# Patient Record
Sex: Female | Born: 1975 | Race: White | Hispanic: No | Smoking: Current every day smoker
Health system: Southern US, Community
[De-identification: ages and names within clinical notes are randomized; demographics above are authoritative.]

## PROBLEM LIST (undated history)

## (undated) DIAGNOSIS — F329 Major depressive disorder, single episode, unspecified: Secondary | ICD-10-CM

## (undated) DIAGNOSIS — J45909 Unspecified asthma, uncomplicated: Secondary | ICD-10-CM

## (undated) DIAGNOSIS — F32A Depression, unspecified: Secondary | ICD-10-CM

## (undated) DIAGNOSIS — F419 Anxiety disorder, unspecified: Secondary | ICD-10-CM

## (undated) DIAGNOSIS — F99 Mental disorder, not otherwise specified: Secondary | ICD-10-CM

## (undated) DIAGNOSIS — K219 Gastro-esophageal reflux disease without esophagitis: Secondary | ICD-10-CM

## (undated) DIAGNOSIS — G4730 Sleep apnea, unspecified: Secondary | ICD-10-CM

## (undated) HISTORY — DX: Unspecified asthma, uncomplicated: J45.909

---

## 1998-01-01 ENCOUNTER — Other Ambulatory Visit: Admission: RE | Admit: 1998-01-01 | Discharge: 1998-01-01 | Payer: Self-pay | Admitting: Obstetrics and Gynecology

## 1998-07-16 ENCOUNTER — Inpatient Hospital Stay (HOSPITAL_COMMUNITY): Admission: AD | Admit: 1998-07-16 | Discharge: 1998-07-20 | Payer: Self-pay | Admitting: Obstetrics and Gynecology

## 1998-09-05 ENCOUNTER — Other Ambulatory Visit: Admission: RE | Admit: 1998-09-05 | Discharge: 1998-09-05 | Payer: Self-pay | Admitting: Obstetrics and Gynecology

## 2000-11-23 ENCOUNTER — Encounter: Payer: Self-pay | Admitting: Internal Medicine

## 2000-11-23 ENCOUNTER — Ambulatory Visit (HOSPITAL_COMMUNITY): Admission: AD | Admit: 2000-11-23 | Discharge: 2000-11-23 | Payer: Self-pay | Admitting: Obstetrics and Gynecology

## 2001-04-14 ENCOUNTER — Emergency Department (HOSPITAL_COMMUNITY): Admission: EM | Admit: 2001-04-14 | Discharge: 2001-04-14 | Payer: Self-pay | Admitting: Emergency Medicine

## 2001-04-14 ENCOUNTER — Encounter: Payer: Self-pay | Admitting: Emergency Medicine

## 2001-05-24 ENCOUNTER — Encounter: Payer: Self-pay | Admitting: Family Medicine

## 2001-05-24 ENCOUNTER — Ambulatory Visit (HOSPITAL_COMMUNITY): Admission: RE | Admit: 2001-05-24 | Discharge: 2001-05-24 | Payer: Self-pay | Admitting: Family Medicine

## 2001-11-21 ENCOUNTER — Emergency Department (HOSPITAL_COMMUNITY): Admission: EM | Admit: 2001-11-21 | Discharge: 2001-11-21 | Payer: Self-pay | Admitting: Emergency Medicine

## 2001-11-23 ENCOUNTER — Other Ambulatory Visit: Admission: RE | Admit: 2001-11-23 | Discharge: 2001-11-23 | Payer: Self-pay | Admitting: *Deleted

## 2001-11-28 ENCOUNTER — Ambulatory Visit (HOSPITAL_COMMUNITY): Admission: RE | Admit: 2001-11-28 | Discharge: 2001-11-28 | Payer: Self-pay | Admitting: *Deleted

## 2001-12-02 ENCOUNTER — Inpatient Hospital Stay (HOSPITAL_COMMUNITY): Admission: AD | Admit: 2001-12-02 | Discharge: 2001-12-02 | Payer: Self-pay | Admitting: Obstetrics and Gynecology

## 2001-12-02 ENCOUNTER — Encounter: Payer: Self-pay | Admitting: Obstetrics and Gynecology

## 2002-03-24 ENCOUNTER — Emergency Department (HOSPITAL_COMMUNITY): Admission: EM | Admit: 2002-03-24 | Discharge: 2002-03-24 | Payer: Self-pay | Admitting: Emergency Medicine

## 2002-07-15 ENCOUNTER — Inpatient Hospital Stay (HOSPITAL_COMMUNITY): Admission: AD | Admit: 2002-07-15 | Discharge: 2002-07-15 | Payer: Self-pay | Admitting: Obstetrics & Gynecology

## 2003-10-17 ENCOUNTER — Emergency Department (HOSPITAL_COMMUNITY): Admission: EM | Admit: 2003-10-17 | Discharge: 2003-10-17 | Payer: Self-pay

## 2004-05-17 ENCOUNTER — Emergency Department: Payer: Self-pay | Admitting: Internal Medicine

## 2004-09-29 ENCOUNTER — Emergency Department (HOSPITAL_COMMUNITY): Admission: EM | Admit: 2004-09-29 | Discharge: 2004-09-29 | Payer: Self-pay | Admitting: Emergency Medicine

## 2005-01-19 ENCOUNTER — Emergency Department: Payer: Self-pay | Admitting: Emergency Medicine

## 2005-07-22 ENCOUNTER — Emergency Department: Payer: Self-pay | Admitting: Emergency Medicine

## 2005-11-07 ENCOUNTER — Emergency Department: Payer: Self-pay | Admitting: Emergency Medicine

## 2005-12-21 ENCOUNTER — Emergency Department: Payer: Self-pay | Admitting: Emergency Medicine

## 2005-12-26 ENCOUNTER — Other Ambulatory Visit: Payer: Self-pay

## 2005-12-26 ENCOUNTER — Emergency Department: Payer: Self-pay | Admitting: Emergency Medicine

## 2006-01-28 ENCOUNTER — Emergency Department: Payer: Self-pay | Admitting: Emergency Medicine

## 2006-02-01 ENCOUNTER — Emergency Department: Payer: Self-pay | Admitting: Emergency Medicine

## 2006-07-31 ENCOUNTER — Emergency Department: Payer: Self-pay | Admitting: Internal Medicine

## 2006-08-24 ENCOUNTER — Emergency Department: Payer: Self-pay | Admitting: Unknown Physician Specialty

## 2006-08-31 ENCOUNTER — Emergency Department: Payer: Self-pay | Admitting: Emergency Medicine

## 2006-11-02 ENCOUNTER — Emergency Department: Payer: Self-pay | Admitting: Emergency Medicine

## 2006-12-17 ENCOUNTER — Emergency Department: Payer: Self-pay | Admitting: Emergency Medicine

## 2007-01-10 ENCOUNTER — Emergency Department: Payer: Self-pay | Admitting: Emergency Medicine

## 2007-02-10 ENCOUNTER — Emergency Department: Payer: Self-pay | Admitting: Emergency Medicine

## 2007-02-11 ENCOUNTER — Emergency Department: Payer: Self-pay | Admitting: Emergency Medicine

## 2007-02-17 ENCOUNTER — Emergency Department: Payer: Self-pay | Admitting: Emergency Medicine

## 2007-02-23 ENCOUNTER — Other Ambulatory Visit: Payer: Self-pay

## 2007-02-23 ENCOUNTER — Emergency Department: Payer: Self-pay | Admitting: Emergency Medicine

## 2007-02-27 ENCOUNTER — Emergency Department: Payer: Self-pay | Admitting: Emergency Medicine

## 2007-02-27 ENCOUNTER — Other Ambulatory Visit: Payer: Self-pay

## 2007-03-10 ENCOUNTER — Emergency Department: Payer: Self-pay | Admitting: Emergency Medicine

## 2007-07-27 ENCOUNTER — Emergency Department: Payer: Self-pay | Admitting: Emergency Medicine

## 2008-05-03 HISTORY — PX: ABDOMINAL HYSTERECTOMY: SHX81

## 2009-06-29 ENCOUNTER — Emergency Department: Payer: Self-pay | Admitting: Emergency Medicine

## 2009-07-03 ENCOUNTER — Emergency Department: Payer: Self-pay | Admitting: Emergency Medicine

## 2009-08-11 ENCOUNTER — Emergency Department: Payer: Self-pay | Admitting: Emergency Medicine

## 2009-08-12 ENCOUNTER — Emergency Department: Payer: Self-pay

## 2009-10-12 ENCOUNTER — Emergency Department: Payer: Self-pay | Admitting: Emergency Medicine

## 2010-03-24 ENCOUNTER — Emergency Department: Payer: Self-pay

## 2010-03-27 ENCOUNTER — Emergency Department: Payer: Self-pay | Admitting: Emergency Medicine

## 2010-07-07 ENCOUNTER — Emergency Department: Payer: Self-pay | Admitting: Emergency Medicine

## 2010-09-09 ENCOUNTER — Ambulatory Visit: Payer: Self-pay | Admitting: Family Medicine

## 2010-12-14 ENCOUNTER — Emergency Department: Payer: Self-pay | Admitting: *Deleted

## 2011-01-26 ENCOUNTER — Emergency Department: Payer: Self-pay | Admitting: Emergency Medicine

## 2011-02-02 ENCOUNTER — Emergency Department: Payer: Self-pay | Admitting: *Deleted

## 2011-04-24 ENCOUNTER — Emergency Department: Payer: Self-pay | Admitting: *Deleted

## 2012-02-16 ENCOUNTER — Inpatient Hospital Stay: Payer: Self-pay | Admitting: Psychiatry

## 2012-02-16 LAB — URINALYSIS, COMPLETE
Bilirubin,UR: NEGATIVE
Nitrite: NEGATIVE
Protein: 30
RBC,UR: 1 /HPF (ref 0–5)
Specific Gravity: 1.026 (ref 1.003–1.030)
WBC UR: 4 /HPF (ref 0–5)

## 2012-02-16 LAB — CBC
HCT: 44.1 % (ref 35.0–47.0)
MCV: 90 fL (ref 80–100)
Platelet: 298 10*3/uL (ref 150–440)
RBC: 4.92 10*6/uL (ref 3.80–5.20)
RDW: 13.2 % (ref 11.5–14.5)
WBC: 13.5 10*3/uL — ABNORMAL HIGH (ref 3.6–11.0)

## 2012-02-16 LAB — COMPREHENSIVE METABOLIC PANEL
Albumin: 4.2 g/dL (ref 3.4–5.0)
Alkaline Phosphatase: 134 U/L (ref 50–136)
Anion Gap: 9 (ref 7–16)
BUN: 10 mg/dL (ref 7–18)
Bilirubin,Total: 0.2 mg/dL (ref 0.2–1.0)
Calcium, Total: 9.3 mg/dL (ref 8.5–10.1)
Co2: 30 mmol/L (ref 21–32)
Creatinine: 0.74 mg/dL (ref 0.60–1.30)
Glucose: 133 mg/dL — ABNORMAL HIGH (ref 65–99)
Osmolality: 282 (ref 275–301)
Sodium: 141 mmol/L (ref 136–145)
Total Protein: 8.2 g/dL (ref 6.4–8.2)

## 2012-02-16 LAB — ETHANOL
Ethanol %: <0.003 % (ref 0.000–0.080)
Ethanol: <3 mg/dL

## 2012-02-16 LAB — DRUG SCREEN, URINE
Amphetamines, Ur Screen: NEGATIVE (ref ?–1000)
Barbiturates, Ur Screen: NEGATIVE (ref ?–200)
Benzodiazepine, Ur Scrn: NEGATIVE (ref ?–200)
Cannabinoid 50 Ng, Ur ~~LOC~~: NEGATIVE (ref ?–50)
Cocaine Metabolite,Ur ~~LOC~~: NEGATIVE (ref ?–300)
MDMA (Ecstasy)Ur Screen: POSITIVE (ref ?–500)
Methadone, Ur Screen: NEGATIVE (ref ?–300)
Phencyclidine (PCP) Ur S: NEGATIVE (ref ?–25)

## 2012-02-16 LAB — TSH: Thyroid Stimulating Horm: 1.15 u[IU]/mL

## 2012-02-17 LAB — LIPID PANEL
HDL Cholesterol: 31 mg/dL — ABNORMAL LOW (ref 40–60)
Ldl Cholesterol, Calc: 98 mg/dL (ref 0–100)
VLDL Cholesterol, Calc: 42 mg/dL — ABNORMAL HIGH (ref 5–40)

## 2013-01-28 ENCOUNTER — Emergency Department: Payer: Self-pay | Admitting: Emergency Medicine

## 2013-01-28 LAB — COMPREHENSIVE METABOLIC PANEL
Albumin: 3.6 g/dL (ref 3.4–5.0)
Alkaline Phosphatase: 129 U/L (ref 50–136)
BUN: 7 mg/dL (ref 7–18)
Bilirubin,Total: 0.3 mg/dL (ref 0.2–1.0)
Co2: 33 mmol/L — ABNORMAL HIGH (ref 21–32)
Creatinine: 0.74 mg/dL (ref 0.60–1.30)
EGFR (Non-African Amer.): >60
Osmolality: 278 (ref 275–301)
Potassium: 3.5 mmol/L (ref 3.5–5.1)
SGOT(AST): 20 U/L (ref 15–37)
SGPT (ALT): 20 U/L (ref 12–78)
Total Protein: 7.3 g/dL (ref 6.4–8.2)

## 2013-01-28 LAB — CBC
HCT: 38.2 % (ref 35.0–47.0)
MCH: 31.1 pg (ref 26.0–34.0)
MCV: 90 fL (ref 80–100)
Platelet: 261 10*3/uL (ref 150–440)
RBC: 4.25 10*6/uL (ref 3.80–5.20)
WBC: 12.4 10*3/uL — ABNORMAL HIGH (ref 3.6–11.0)

## 2013-01-28 LAB — URINALYSIS, COMPLETE
Bacteria: NONE SEEN
Bilirubin,UR: NEGATIVE
Blood: NEGATIVE
Ph: 6 (ref 4.5–8.0)
Protein: NEGATIVE
RBC,UR: 1 /HPF (ref 0–5)
Specific Gravity: 1.019 (ref 1.003–1.030)
Squamous Epithelial: 5
WBC UR: <1 /HPF (ref 0–5)

## 2013-01-28 LAB — LIPASE, BLOOD: Lipase: 107 U/L (ref 73–393)

## 2013-01-29 ENCOUNTER — Emergency Department (HOSPITAL_COMMUNITY): Payer: Medicaid Other

## 2013-01-29 ENCOUNTER — Emergency Department (HOSPITAL_COMMUNITY)
Admission: EM | Admit: 2013-01-29 | Discharge: 2013-01-29 | Disposition: A | Discharge status: Home / Self Care | Payer: Medicaid Other | Attending: Emergency Medicine | Admitting: Emergency Medicine

## 2013-01-29 ENCOUNTER — Encounter (HOSPITAL_COMMUNITY): Payer: Self-pay | Admitting: *Deleted

## 2013-01-29 DIAGNOSIS — Z3202 Encounter for pregnancy test, result negative: Secondary | ICD-10-CM | POA: Insufficient documentation

## 2013-01-29 DIAGNOSIS — F17200 Nicotine dependence, unspecified, uncomplicated: Secondary | ICD-10-CM | POA: Insufficient documentation

## 2013-01-29 DIAGNOSIS — Z79899 Other long term (current) drug therapy: Secondary | ICD-10-CM | POA: Insufficient documentation

## 2013-01-29 DIAGNOSIS — J45909 Unspecified asthma, uncomplicated: Secondary | ICD-10-CM | POA: Insufficient documentation

## 2013-01-29 DIAGNOSIS — K8020 Calculus of gallbladder without cholecystitis without obstruction: Secondary | ICD-10-CM

## 2013-01-29 DIAGNOSIS — R112 Nausea with vomiting, unspecified: Secondary | ICD-10-CM | POA: Insufficient documentation

## 2013-01-29 DIAGNOSIS — R197 Diarrhea, unspecified: Secondary | ICD-10-CM | POA: Insufficient documentation

## 2013-01-29 LAB — CBC WITH DIFFERENTIAL/PLATELET
Eosinophils Relative: 3 % (ref 0–5)
HCT: 39.9 % (ref 36.0–46.0)
Hemoglobin: 13.4 g/dL (ref 12.0–15.0)
Lymphocytes Relative: 29 % (ref 12–46)
MCH: 30.5 pg (ref 26.0–34.0)
Monocytes Absolute: 0.6 10*3/uL (ref 0.1–1.0)
Neutrophils Relative %: 62 % (ref 43–77)
Platelets: 264 10*3/uL (ref 150–400)
RDW: 12.4 % (ref 11.5–15.5)
WBC: 10.6 10*3/uL — ABNORMAL HIGH (ref 4.0–10.5)

## 2013-01-29 LAB — COMPREHENSIVE METABOLIC PANEL
Alkaline Phosphatase: 114 U/L (ref 39–117)
BUN: 6 mg/dL (ref 6–23)
CO2: 30 mEq/L (ref 19–32)
Calcium: 8.9 mg/dL (ref 8.4–10.5)
Chloride: 101 mEq/L (ref 96–112)
Potassium: 3.4 mEq/L — ABNORMAL LOW (ref 3.5–5.1)
Total Protein: 7.6 g/dL (ref 6.0–8.3)

## 2013-01-29 LAB — URINALYSIS, ROUTINE W REFLEX MICROSCOPIC
Bilirubin Urine: NEGATIVE
Glucose, UA: NEGATIVE mg/dL
Hgb urine dipstick: NEGATIVE
Ketones, ur: NEGATIVE mg/dL
pH: 6 (ref 5.0–8.0)

## 2013-01-29 LAB — LIPASE, BLOOD: Lipase: 27 U/L (ref 11–59)

## 2013-01-29 LAB — POCT PREGNANCY, URINE: Preg Test, Ur: NEGATIVE

## 2013-01-29 MED ORDER — SODIUM CHLORIDE 0.9 % IV SOLN
Freq: Once | INTRAVENOUS | Status: AC
  Administered 2013-01-29: 18:00:00 via INTRAVENOUS

## 2013-01-29 MED ORDER — HYDROMORPHONE HCL PF 1 MG/ML IJ SOLN
1.0000 mg | Freq: Once | INTRAMUSCULAR | Status: AC
  Administered 2013-01-29: 1 mg via INTRAVENOUS
  Filled 2013-01-29: qty 1

## 2013-01-29 MED ORDER — ONDANSETRON HCL 4 MG/2ML IJ SOLN
4.0000 mg | Freq: Once | INTRAMUSCULAR | Status: AC
  Administered 2013-01-29: 4 mg via INTRAVENOUS
  Filled 2013-01-29: qty 2

## 2013-01-29 MED ORDER — ONDANSETRON HCL 8 MG PO TABS: 8.0000 mg | ORAL_TABLET | Freq: Three times a day (TID) | ORAL | Status: DC | PRN

## 2013-01-29 MED ORDER — HYDROMORPHONE HCL PF 1 MG/ML IJ SOLN
0.5000 mg | Freq: Once | INTRAMUSCULAR | Status: AC
  Administered 2013-01-29: 0.5 mg via INTRAVENOUS
  Filled 2013-01-29: qty 1

## 2013-01-29 MED ORDER — OXYCODONE-ACETAMINOPHEN 5-325 MG PO TABS: 1.0000 | ORAL_TABLET | ORAL | Status: DC | PRN

## 2013-01-29 NOTE — ED Provider Notes (Signed)
Pt received from [place], [place].  Pt presented to ED w/ 2 weeks RUQ pain and N/V, Abd [place] today showed cholelithiasis and labs unremarkable.  Patient aware of all test results.  Her pain is improved currently and she is tolerating pos.  D/c'd home w/ percocet, zofran and referral to [facility].  Return precautions discussed. 9:52 PM   Otilio Miu, PA-C 01/29/13 2152

## 2013-01-29 NOTE — ED Provider Notes (Signed)
CSN: [id number]     Arrival date & time 01/29/13  1231 History   First MD Initiated Contact with Patient 01/29/13 1727     No chief complaint on file.  (Consider location/radiation/quality/duration/timing/severity/associated sxs/prior Treatment) HPI Tracie Hodge is a 37 y.o. female presents to emergency department complaining of abdominal pain nausea, vomiting, diarrhea. States symptoms began 2 weeks ago. States symptoms don't gradually worsening. Patient states she has tried taking ibuprofen and Zantac for the pain with no improvement. Patient states pain and vomiting is worse after each pain. States no loss 2 days the pain has been constant. Pain is mainly in the right upper quadrant radiating to the right flank. Patient states she went to [facility] emergency department yesterday where the blood work and an x-ray and discharged her home. Patient states today she tried to follow up with her primary care doctor and call them but they did not have an appointment for her. Patient states that in the last 2 days she hasn't been able to keep anything down. Denies blood in her emesis or stool. No history of the same. History reviewed. No pertinent past medical history. Past Surgical History  Procedure Laterality Date  . Abdominal hysterectomy    . Cesarean section     No family history on file. History  Substance Use Topics  . Smoking status: Current Every Day Smoker  . Smokeless tobacco: Not on file  . Alcohol Use: No   OB History   Grav Para Term Preterm Abortions TAB SAB Ect Mult Living                 Review of Systems  Constitutional: Negative for fever and chills.  HENT: Negative for neck pain and neck stiffness.   Respiratory: Negative for cough, chest tightness and shortness of breath.   Cardiovascular: Negative for chest pain, palpitations and leg swelling.  Gastrointestinal: Positive for nausea, vomiting, abdominal pain and diarrhea.  Genitourinary: Negative for dysuria, flank  pain, vaginal bleeding, vaginal discharge, vaginal pain and pelvic pain.  Musculoskeletal: Negative for myalgias and arthralgias.  Skin: Negative for rash.  Neurological: Negative for dizziness, weakness and headaches.  All other systems reviewed and are negative.    Allergies  Review of patient's allergies indicates no known allergies.  Home Medications   Current Outpatient Rx  Name  Route  Sig  Dispense  Refill  . buPROPion (WELLBUTRIN XL) 300 MG 24 hr tablet   Oral   Take 450 mg by mouth every morning.         Marland Kitchen ibuprofen (ADVIL,MOTRIN) 200 MG tablet   Oral   Take 800 mg by mouth every 6 (six) hours as needed for pain.         . ranitidine (ZANTAC) 150 MG tablet   Oral   Take 150 mg by mouth at bedtime.         . trazodone (DESYREL) 300 MG tablet   Oral   Take 300 mg by mouth at bedtime.          BP 122/76  Pulse 61  Temp(Src) 98 F (36.7 C) (Oral)  Resp 17  SpO2 96% Physical Exam  Nursing note and vitals reviewed. Constitutional: She is oriented to person, place, and time. She appears well-developed and well-nourished. No distress.  HENT:  Head: Normocephalic.  Eyes: Conjunctivae are normal.  Neck: Neck supple.  Cardiovascular: Normal rate, regular rhythm and normal heart sounds.   Pulmonary/Chest: Effort normal and breath sounds normal. No respiratory  distress. She has no wheezes. She has no rales.  Abdominal: Soft. Bowel sounds are normal. She exhibits no distension. There is tenderness. There is no rebound and no guarding.  Right upper quadrant tenderness  Musculoskeletal: She exhibits no edema.  Neurological: She is alert and oriented to person, place, and time.  Skin: Skin is warm and dry.  Psychiatric: She has a normal mood and affect. Her behavior is normal.    ED Course  Procedures (including critical care time) Labs Review Labs Reviewed  COMPREHENSIVE METABOLIC PANEL - Abnormal; Notable for the following:    Potassium 3.4 (*)     Glucose, Bld 114 (*)    All other components within normal limits  CBC WITH DIFFERENTIAL - Abnormal; Notable for the following:    WBC 10.6 (*)    All other components within normal limits  URINALYSIS, ROUTINE W REFLEX MICROSCOPIC  LIPASE, BLOOD  POCT I-STAT TROPONIN I  POCT PREGNANCY, URINE   Imaging Review US Abdomen Complete  01/29/2013   CLINICAL DATA:  Right upper quadrant abdominal pain.  EXAM: ULTRASOUND ABDOMEN COMPLETE  COMPARISON:  None.  FINDINGS: Gallbladder  1.8 cm stone of the gallbladder. Wall thickness is within normal limits. Negative sonographic Murphy sign.  Common bile duct  Diameter: Normal. 6 mm in diameter.  Liver  No focal lesion identified. Within normal limits in parenchymal echogenicity.  IVC  No abnormality visualized.  Pancreas  Visualized portion unremarkable.  Spleen  Size and appearance within normal limits.  Right Kidney  Length: 11.8 cm Echogenicity within normal limits. No mass or hydronephrosis visualized.  Left Kidney  Length: 12.4 cm Echogenicity within normal limits. No mass or hydronephrosis visualized.  Abdominal aorta  Maximum diameter 2.5 cm.  IMPRESSION: 1. Large stone of the gallbladder. 2. Otherwise normal exam.   Electronically Signed   By: Geanie Cooley   On: 01/29/2013 19:31    MDM  No diagnosis found.  Patient here with right upper quadrant abdominal pain for several weeks, pain has been intermittent until last few days there has been constant. Also having nausea, vomiting, loose stools. On exam patient did have right upper quadrant tenderness, no guarding, no rebound tenderness. Ultrasound obtained after all labs came back normal. Ultrasound showed a large gallstone in the gallbladder. There is no signs of cholecystitis. I discussed results with patient and with Dr. Fonnie Jarvis. Will try that her pain management emergency department. If unable to get pain under control, will call surgery.  8:35 PM Pt pending 3rd dose of pain medications. Pain slightly  improved. Pt signed out to Dr. Fonnie Jarvis and PA schinlever at shift change. Plan pain reassessment, disposition based on improvement.   Filed Vitals:   01/29/13 1242 01/29/13 1718 01/29/13 1914  BP: 133/85 122/76 104/60  Pulse: 87 61 63  Temp: 98 F (36.7 C)    TempSrc: Oral    Resp: 18 17 17   SpO2: 97% 96% 93%       Lottie Mussel, PA-C 01/29/13 2036

## 2013-01-29 NOTE — ED Notes (Signed)
Tracie Hodge - RTSA [phone]

## 2013-01-29 NOTE — ED Notes (Signed)
Pain increases after she eats

## 2013-01-29 NOTE — ED Notes (Signed)
Pt presents with right sided abdominal pain for the past 2 weeks- N/V/D associated with symptoms.  Pt was seen at [facility] yesterday- states she was given a shot of Phenergan and sent home but they did not know where the pain was coming from.  Denies any difficulty with urination or vaginal discharge.

## 2013-01-29 NOTE — ED Notes (Signed)
Pt is here with mid abdominal pain and it does not go away, causing diarrhea, vomiting, and has been going on for 2 weeks.  No urinary or vaginal symptoms.  No chest pain or sob.  Seen at [facility] yesterday and had urine, blood work, and Personal assistant.

## 2013-01-31 NOTE — ED Provider Notes (Signed)
Medical screening examination/treatment/procedure(s) were performed by non-physician practitioner and as supervising physician I was immediately available for consultation/collaboration.  Hurman Horn, MD 01/31/13 [phone]

## 2013-01-31 NOTE — ED Provider Notes (Signed)
Medical screening examination/treatment/procedure(s) were performed by non-physician practitioner and as supervising physician I was immediately available for consultation/collaboration.  Hurman Horn, MD 01/31/13 [phone]

## 2013-02-02 ENCOUNTER — Ambulatory Visit (INDEPENDENT_AMBULATORY_CARE_PROVIDER_SITE_OTHER): Payer: Medicaid Other | Admitting: General Surgery

## 2013-02-02 ENCOUNTER — Encounter (HOSPITAL_COMMUNITY): Payer: Self-pay | Admitting: Pharmacy Technician

## 2013-02-02 ENCOUNTER — Encounter (INDEPENDENT_AMBULATORY_CARE_PROVIDER_SITE_OTHER): Payer: Self-pay | Admitting: General Surgery

## 2013-02-02 VITALS — BP 108/74 | HR 74 | Temp 98.3°F | Ht 63.0 in | Wt 246.0 lb

## 2013-02-02 DIAGNOSIS — K8020 Calculus of gallbladder without cholecystitis without obstruction: Secondary | ICD-10-CM

## 2013-02-02 NOTE — Progress Notes (Signed)
Patient ID: Tracie Hodge, female   DOB: 1975-12-02, 37 y.o.   MRN: [id number]  No chief complaint on file.   HPI Tracie Hodge is a 37 y.o. female.  The patient at their 37 year old female who was recently seen in the [facility] ER on 01/29/2013 for an evaluation of right upper quadrant pain. Upon their evaluation patient had gallstones on ultrasound.  The patient states the pain began approximately 2 weeks ago discontinued after being seen in the ER. She does say she has some nausea vomiting and Staying to clear liquid diet at this time.  HPI  No past medical history on file.  Past Surgical History  Procedure Laterality Date  . Abdominal hysterectomy    . Cesarean section      No family history on file.  Social History History  Substance Use Topics  . Smoking status: Current Every Day Smoker  . Smokeless tobacco: Not on file  . Alcohol Use: No    No Known Allergies  Current Outpatient Prescriptions  Medication Sig Dispense Refill  . buPROPion (WELLBUTRIN XL) 300 MG 24 hr tablet Take 450 mg by mouth every morning.      Marland Kitchen ibuprofen (ADVIL,MOTRIN) 200 MG tablet Take 800 mg by mouth every 6 (six) hours as needed for pain.      Marland Kitchen ondansetron (ZOFRAN) 8 MG tablet Take 1 tablet (8 mg total) by mouth every 8 (eight) hours as needed for nausea.  20 tablet  0  . oxyCODONE-acetaminophen (PERCOCET/ROXICET) 5-325 MG per tablet Take 1 tablet by mouth every 4 (four) hours as needed for pain.  20 tablet  0  . ranitidine (ZANTAC) 150 MG tablet Take 150 mg by mouth at bedtime.      . trazodone (DESYREL) 300 MG tablet Take 300 mg by mouth at bedtime.       No current facility-administered medications for this visit.    Review of Systems Review of Systems  Constitutional: Negative.   HENT: Negative.   Respiratory: Negative.   Cardiovascular: Negative.   Gastrointestinal: Positive for nausea, vomiting and abdominal pain.  Neurological: Negative.   All other systems reviewed and are  negative.    There were no vitals taken for this visit.  Physical Exam Physical Exam  Vitals reviewed. Constitutional: She is oriented to person, place, and time. She appears well-developed and well-nourished.  HENT:  Head: Normocephalic and atraumatic.  Eyes: Conjunctivae and EOM are normal. Pupils are equal, round, and reactive to light.  Neck: Normal range of motion. Neck supple.  Cardiovascular: Normal rate, regular rhythm and normal heart sounds.   Pulmonary/Chest: Effort normal.  Abdominal: Soft. Bowel sounds are normal. She exhibits no distension. There is tenderness (ruq). There is no rebound and no guarding.  Musculoskeletal: Normal range of motion.  Neurological: She is alert and oriented to person, place, and time.    Data Reviewed Ultrasound reveals multiple gallstones within the gallbladder. LFTs within normal limits.  Assessment    37 year old female with symptomatic cholelithiasis     Plan    1. We'll proceed to the operating room for a laparoscopic cholecystectomy , with no IOC  2.All risks and benefits were discussed with the patient to generally include: infection, bleeding, possible need for post op ERCP, damage to the bile ducts, and bile leak. Alternatives were offered and described.  All questions were answered and the patient voiced understanding of the procedure and wishes to proceed at this point with a laparoscopic cholecystectomy  Marigene Ehlers., Nina Hoar 02/02/2013, 1:24 PM

## 2013-02-06 ENCOUNTER — Encounter (HOSPITAL_COMMUNITY): Payer: Self-pay | Admitting: *Deleted

## 2013-02-06 ENCOUNTER — Other Ambulatory Visit (HOSPITAL_COMMUNITY): Payer: Self-pay | Admitting: *Deleted

## 2013-02-06 MED ORDER — DEXTROSE 5 % IV SOLN
2.0000 g | INTRAVENOUS | Status: AC
  Administered 2013-02-07: 2 g via INTRAVENOUS
  Filled 2013-02-06: qty 20

## 2013-02-07 ENCOUNTER — Encounter (HOSPITAL_COMMUNITY): Payer: Medicaid Other | Admitting: Anesthesiology

## 2013-02-07 ENCOUNTER — Encounter (HOSPITAL_COMMUNITY)
Admission: RE | Disposition: A | Discharge status: Home / Self Care | Payer: Self-pay | Source: Ambulatory Visit | Attending: General Surgery

## 2013-02-07 ENCOUNTER — Encounter (HOSPITAL_COMMUNITY): Payer: Self-pay | Admitting: *Deleted

## 2013-02-07 ENCOUNTER — Ambulatory Visit (HOSPITAL_COMMUNITY): Payer: Medicaid Other | Admitting: Anesthesiology

## 2013-02-07 ENCOUNTER — Ambulatory Visit (HOSPITAL_COMMUNITY)
Admission: RE | Admit: 2013-02-07 | Discharge: 2013-02-07 | Disposition: A | Discharge status: Home / Self Care | Payer: Medicaid Other | Source: Ambulatory Visit | Attending: General Surgery | Admitting: General Surgery

## 2013-02-07 DIAGNOSIS — K8010 Calculus of gallbladder with chronic cholecystitis without obstruction: Secondary | ICD-10-CM

## 2013-02-07 DIAGNOSIS — Z79899 Other long term (current) drug therapy: Secondary | ICD-10-CM | POA: Insufficient documentation

## 2013-02-07 DIAGNOSIS — Z01812 Encounter for preprocedural laboratory examination: Secondary | ICD-10-CM | POA: Insufficient documentation

## 2013-02-07 DIAGNOSIS — F329 Major depressive disorder, single episode, unspecified: Secondary | ICD-10-CM | POA: Insufficient documentation

## 2013-02-07 DIAGNOSIS — K219 Gastro-esophageal reflux disease without esophagitis: Secondary | ICD-10-CM | POA: Insufficient documentation

## 2013-02-07 DIAGNOSIS — F17200 Nicotine dependence, unspecified, uncomplicated: Secondary | ICD-10-CM | POA: Insufficient documentation

## 2013-02-07 DIAGNOSIS — F411 Generalized anxiety disorder: Secondary | ICD-10-CM | POA: Insufficient documentation

## 2013-02-07 DIAGNOSIS — J45909 Unspecified asthma, uncomplicated: Secondary | ICD-10-CM | POA: Insufficient documentation

## 2013-02-07 DIAGNOSIS — G4730 Sleep apnea, unspecified: Secondary | ICD-10-CM | POA: Insufficient documentation

## 2013-02-07 DIAGNOSIS — F3289 Other specified depressive episodes: Secondary | ICD-10-CM | POA: Insufficient documentation

## 2013-02-07 HISTORY — DX: Anxiety disorder, unspecified: F41.9

## 2013-02-07 HISTORY — DX: Depression, unspecified: F32.A

## 2013-02-07 HISTORY — DX: Mental disorder, not otherwise specified: F99

## 2013-02-07 HISTORY — DX: Major depressive disorder, single episode, unspecified: F32.9

## 2013-02-07 HISTORY — DX: Gastro-esophageal reflux disease without esophagitis: K21.9

## 2013-02-07 HISTORY — PX: CHOLECYSTECTOMY: SHX55

## 2013-02-07 HISTORY — DX: Sleep apnea, unspecified: G47.30

## 2013-02-07 SURGERY — LAPAROSCOPIC CHOLECYSTECTOMY
Anesthesia: General | Site: Abdomen | Wound class: Contaminated

## 2013-02-07 MED ORDER — ONDANSETRON HCL 4 MG/2ML IJ SOLN
INTRAMUSCULAR | Status: AC
  Filled 2013-02-07: qty 2

## 2013-02-07 MED ORDER — HYDROMORPHONE HCL PF 1 MG/ML IJ SOLN
0.2500 mg | INTRAMUSCULAR | Status: DC | PRN
  Administered 2013-02-07 (×4): 0.5 mg via INTRAVENOUS

## 2013-02-07 MED ORDER — CHLORHEXIDINE GLUCONATE 4 % EX LIQD: 1.0000 "application " | Freq: Once | CUTANEOUS | Status: DC

## 2013-02-07 MED ORDER — SUCCINYLCHOLINE CHLORIDE 20 MG/ML IJ SOLN
INTRAMUSCULAR | Status: DC | PRN
  Administered 2013-02-07: 100 mg via INTRAVENOUS

## 2013-02-07 MED ORDER — ROCURONIUM BROMIDE 100 MG/10ML IV SOLN
INTRAVENOUS | Status: DC | PRN
  Administered 2013-02-07: 5 mg via INTRAVENOUS
  Administered 2013-02-07: 10 mg via INTRAVENOUS
  Administered 2013-02-07: 20 mg via INTRAVENOUS

## 2013-02-07 MED ORDER — OXYCODONE-ACETAMINOPHEN 10-325 MG PO TABS: 1.0000 | ORAL_TABLET | ORAL | Status: DC | PRN

## 2013-02-07 MED ORDER — CEFAZOLIN SODIUM-DEXTROSE 2-3 GM-% IV SOLR
INTRAVENOUS | Status: AC
  Filled 2013-02-07: qty 50

## 2013-02-07 MED ORDER — 0.9 % SODIUM CHLORIDE (POUR BTL) OPTIME
TOPICAL | Status: DC | PRN
  Administered 2013-02-07: 1000 mL

## 2013-02-07 MED ORDER — HYDROMORPHONE HCL PF 1 MG/ML IJ SOLN
INTRAMUSCULAR | Status: AC
  Filled 2013-02-07: qty 1

## 2013-02-07 MED ORDER — BUPIVACAINE HCL 0.25 % IJ SOLN
INTRAMUSCULAR | Status: DC | PRN
  Administered 2013-02-07: 8 mL

## 2013-02-07 MED ORDER — LIDOCAINE HCL (CARDIAC) 20 MG/ML IV SOLN
INTRAVENOUS | Status: DC | PRN
  Administered 2013-02-07: 80 mg via INTRAVENOUS

## 2013-02-07 MED ORDER — ONDANSETRON HCL 4 MG/2ML IJ SOLN
INTRAMUSCULAR | Status: DC | PRN
  Administered 2013-02-07: 4 mg via INTRAMUSCULAR

## 2013-02-07 MED ORDER — LACTATED RINGERS IV SOLN
INTRAVENOUS | Status: DC
  Administered 2013-02-07: 12:00:00 via INTRAVENOUS

## 2013-02-07 MED ORDER — SODIUM CHLORIDE 0.9 % IR SOLN
Status: DC | PRN
  Administered 2013-02-07: 1

## 2013-02-07 MED ORDER — PROPOFOL 10 MG/ML IV BOLUS
INTRAVENOUS | Status: DC | PRN
  Administered 2013-02-07: 200 mg via INTRAVENOUS

## 2013-02-07 MED ORDER — NEOSTIGMINE METHYLSULFATE 1 MG/ML IJ SOLN
INTRAMUSCULAR | Status: DC | PRN
  Administered 2013-02-07: 4 mg via INTRAVENOUS

## 2013-02-07 MED ORDER — FENTANYL CITRATE 0.05 MG/ML IJ SOLN
INTRAMUSCULAR | Status: DC | PRN
  Administered 2013-02-07 (×2): 50 ug via INTRAVENOUS
  Administered 2013-02-07: 100 ug via INTRAVENOUS

## 2013-02-07 MED ORDER — LACTATED RINGERS IV SOLN
INTRAVENOUS | Status: DC | PRN
  Administered 2013-02-07 (×2): via INTRAVENOUS

## 2013-02-07 MED ORDER — MIDAZOLAM HCL 5 MG/5ML IJ SOLN
INTRAMUSCULAR | Status: DC | PRN
  Administered 2013-02-07: 1 mg via INTRAVENOUS

## 2013-02-07 MED ORDER — ARTIFICIAL TEARS OP OINT
TOPICAL_OINTMENT | OPHTHALMIC | Status: DC | PRN
  Administered 2013-02-07: 1 via OPHTHALMIC

## 2013-02-07 MED ORDER — ONDANSETRON HCL 4 MG/2ML IJ SOLN
4.0000 mg | Freq: Once | INTRAMUSCULAR | Status: AC | PRN
  Administered 2013-02-07: 4 mg via INTRAVENOUS

## 2013-02-07 MED ORDER — DIPHENHYDRAMINE HCL 50 MG/ML IJ SOLN: 12.5000 mg | Freq: Once | INTRAMUSCULAR | Status: DC

## 2013-02-07 MED ORDER — GLYCOPYRROLATE 0.2 MG/ML IJ SOLN
INTRAMUSCULAR | Status: DC | PRN
  Administered 2013-02-07: 0.6 mg via INTRAVENOUS

## 2013-02-07 SURGICAL SUPPLY — 45 items
APL SKNCLS STERI-STRIP NONHPOA (GAUZE/BANDAGES/DRESSINGS) ×1
BAG SPEC RTRVL LRG 6X4 10 (ENDOMECHANICALS)
BENZOIN TINCTURE PRP APPL 2/3 (GAUZE/BANDAGES/DRESSINGS) ×2 IMPLANT
CANISTER SUCTION 2500CC (MISCELLANEOUS) ×2 IMPLANT
CHLORAPREP W/TINT 26ML (MISCELLANEOUS) ×2 IMPLANT
CLIP LIGATING HEMO O LOK GREEN (MISCELLANEOUS) ×2 IMPLANT
CLOTH BEACON ORANGE TIMEOUT ST (SAFETY) ×2 IMPLANT
COVER MAYO STAND STRL (DRAPES) IMPLANT
COVER SURGICAL LIGHT HANDLE (MISCELLANEOUS) ×2 IMPLANT
COVER TRANSDUCER ULTRASND (DRAPES) ×2 IMPLANT
DEVICE TROCAR PUNCTURE CLOSURE (ENDOMECHANICALS) ×2 IMPLANT
DRAPE C-ARM 42X72 X-RAY (DRAPES) IMPLANT
DRAPE UTILITY 15X26 W/TAPE STR (DRAPE) ×4 IMPLANT
ELECT REM PT RETURN 9FT ADLT (ELECTROSURGICAL) ×2
ELECTRODE REM PT RTRN 9FT ADLT (ELECTROSURGICAL) ×1 IMPLANT
GAUZE SPONGE 2X2 8PLY STRL LF (GAUZE/BANDAGES/DRESSINGS) ×1 IMPLANT
GLOVE BIO SURGEON STRL SZ7.5 (GLOVE) ×2 IMPLANT
GLOVE BIOGEL PI IND STRL 7.0 (GLOVE) IMPLANT
GLOVE BIOGEL PI IND STRL 7.5 (GLOVE) IMPLANT
GLOVE BIOGEL PI INDICATOR 7.0 (GLOVE) ×1
GLOVE BIOGEL PI INDICATOR 7.5 (GLOVE) ×1
GLOVE SS BIOGEL STRL SZ 6.5 (GLOVE) IMPLANT
GLOVE SUPERSENSE BIOGEL SZ 6.5 (GLOVE) ×1
GOWN STRL NON-REIN LRG LVL3 (GOWN DISPOSABLE) ×6 IMPLANT
GOWN STRL REIN XL XLG (GOWN DISPOSABLE) ×2 IMPLANT
IV CATH 14GX2 1/4 (CATHETERS) IMPLANT
KIT BASIN OR (CUSTOM PROCEDURE TRAY) ×2 IMPLANT
KIT ROOM TURNOVER OR (KITS) ×2 IMPLANT
NDL INSUFFLATION 14GA 120MM (NEEDLE) ×1 IMPLANT
NEEDLE INSUFFLATION 14GA 120MM (NEEDLE) ×2 IMPLANT
NS IRRIG 1000ML POUR BTL (IV SOLUTION) ×2 IMPLANT
PAD ARMBOARD 7.5X6 YLW CONV (MISCELLANEOUS) ×4 IMPLANT
POUCH SPECIMEN RETRIEVAL 10MM (ENDOMECHANICALS) IMPLANT
SCISSORS LAP 5X35 DISP (ENDOMECHANICALS) ×2 IMPLANT
SET CHOLANGIOGRAPHY FRANKLIN (SET/KITS/TRAYS/PACK) IMPLANT
SET IRRIG TUBING LAPAROSCOPIC (IRRIGATION / IRRIGATOR) ×2 IMPLANT
SLEEVE ENDOPATH XCEL 5M (ENDOMECHANICALS) ×2 IMPLANT
SPECIMEN JAR SMALL (MISCELLANEOUS) ×2 IMPLANT
SPONGE GAUZE 2X2 STER 10/PKG (GAUZE/BANDAGES/DRESSINGS) ×1
SUT MNCRL AB 3-0 PS2 18 (SUTURE) ×2 IMPLANT
TOWEL OR 17X24 6PK STRL BLUE (TOWEL DISPOSABLE) ×2 IMPLANT
TOWEL OR 17X26 10 PK STRL BLUE (TOWEL DISPOSABLE) ×2 IMPLANT
TRAY LAPAROSCOPIC (CUSTOM PROCEDURE TRAY) ×2 IMPLANT
TROCAR XCEL NON-BLD 11X100MML (ENDOMECHANICALS) ×2 IMPLANT
TROCAR XCEL NON-BLD 5MMX100MML (ENDOMECHANICALS) ×2 IMPLANT

## 2013-02-07 NOTE — H&P (View-Only) (Signed)
Patient ID: Tracie Hodge, female   DOB: 06/25/1975, 37 y.o.   MRN: [id number]  No chief complaint on file.   HPI Tracie Hodge is a 37 y.o. female.  The patient at their 37-year-old female who was recently seen in the [place] ER on 01/29/2013 for an evaluation of right upper quadrant pain. Upon their evaluation patient had gallstones on ultrasound.  The patient states the pain began approximately 2 weeks ago discontinued after being seen in the ER. She does say she has some nausea vomiting and Staying to clear liquid diet at this time.  HPI  No past medical history on file.  Past Surgical History  Procedure Laterality Date  . Abdominal hysterectomy    . Cesarean section      No family history on file.  Social History History  Substance Use Topics  . Smoking status: Current Every Day Smoker  . Smokeless tobacco: Not on file  . Alcohol Use: No    No Known Allergies  Current Outpatient Prescriptions  Medication Sig Dispense Refill  . buPROPion (WELLBUTRIN XL) 300 MG 24 hr tablet Take 450 mg by mouth every morning.      . ibuprofen (ADVIL,MOTRIN) 200 MG tablet Take 800 mg by mouth every 6 (six) hours as needed for pain.      . ondansetron (ZOFRAN) 8 MG tablet Take 1 tablet (8 mg total) by mouth every 8 (eight) hours as needed for nausea.  20 tablet  0  . oxyCODONE-acetaminophen (PERCOCET/ROXICET) 5-325 MG per tablet Take 1 tablet by mouth every 4 (four) hours as needed for pain.  20 tablet  0  . ranitidine (ZANTAC) 150 MG tablet Take 150 mg by mouth at bedtime.      . trazodone (DESYREL) 300 MG tablet Take 300 mg by mouth at bedtime.       No current facility-administered medications for this visit.    Review of Systems Review of Systems  Constitutional: Negative.   HENT: Negative.   Respiratory: Negative.   Cardiovascular: Negative.   Gastrointestinal: Positive for nausea, vomiting and abdominal pain.  Neurological: Negative.   All other systems reviewed and are  negative.    There were no vitals taken for this visit.  Physical Exam Physical Exam  Vitals reviewed. Constitutional: She is oriented to person, place, and time. She appears well-developed and well-nourished.  HENT:  Head: Normocephalic and atraumatic.  Eyes: Conjunctivae and EOM are normal. Pupils are equal, round, and reactive to light.  Neck: Normal range of motion. Neck supple.  Cardiovascular: Normal rate, regular rhythm and normal heart sounds.   Pulmonary/Chest: Effort normal.  Abdominal: Soft. Bowel sounds are normal. She exhibits no distension. There is tenderness (ruq). There is no rebound and no guarding.  Musculoskeletal: Normal range of motion.  Neurological: She is alert and oriented to person, place, and time.    Data Reviewed Ultrasound reveals multiple gallstones within the gallbladder. LFTs within normal limits.  Assessment    37-year-old female with symptomatic cholelithiasis     Plan    1. We'll proceed to the operating room for a laparoscopic cholecystectomy , with no IOC  2.All risks and benefits were discussed with the patient to generally include: infection, bleeding, possible need for post op ERCP, damage to the bile ducts, and bile leak. Alternatives were offered and described.  All questions were answered and the patient voiced understanding of the procedure and wishes to proceed at this point with a laparoscopic cholecystectomy           Nikoletta Varma Jr., Cheryll Keisler 02/02/2013, 1:24 PM    

## 2013-02-07 NOTE — Anesthesia Preprocedure Evaluation (Signed)
Anesthesia Evaluation  Patient identified by MRN, date of birth, ID band Patient awake    Reviewed: Allergy & Precautions, H&P , NPO status , Patient's Chart, lab work & pertinent test results  Airway       Dental   Pulmonary asthma , sleep apnea , Current Smoker,          Cardiovascular     Neuro/Psych Anxiety Depression    GI/Hepatic GERD-  ,  Endo/Other    Renal/GU      Musculoskeletal   Abdominal   Peds  Hematology   Anesthesia Other Findings   Reproductive/Obstetrics                           Anesthesia Physical Anesthesia Plan  ASA: II  Anesthesia Plan: General   Post-op Pain Management:    Induction: Intravenous  Airway Management Planned: Oral ETT  Additional Equipment:   Intra-op Plan:   Post-operative Plan: Extubation in OR  Informed Consent: I have reviewed the patients History and Physical, chart, labs and discussed the procedure including the risks, benefits and alternatives for the proposed anesthesia with the patient or authorized representative who has indicated his/her understanding and acceptance.     Plan Discussed with: CRNA, Anesthesiologist and Surgeon  Anesthesia Plan Comments:         Anesthesia Quick Evaluation

## 2013-02-07 NOTE — Interval H&P Note (Signed)
History and Physical Interval Note:  02/07/2013 11:12 AM  Tracie Hodge  has presented today for surgery, with the diagnosis of gallstones   The various methods of treatment have been discussed with the patient and family. After consideration of risks, benefits and other options for treatment, the patient has consented to  Procedure(s): LAPAROSCOPIC CHOLECYSTECTOMY (N/A) as a surgical intervention .  The patient's history has been reviewed, patient examined, no change in status, stable for surgery.  I have reviewed the patient's chart and labs.  Questions were answered to the patient's satisfaction.     Marigene Ehlers., Jed Limerick

## 2013-02-07 NOTE — Transfer of Care (Signed)
Immediate Anesthesia Transfer of Care Note  Patient: Tracie Hodge  Procedure(s) Performed: Procedure(s): LAPAROSCOPIC CHOLECYSTECTOMY (N/A)  Patient Location: PACU  Anesthesia Type:General  Level of Consciousness: awake, alert  and oriented  Airway & Oxygen Therapy: Patient Spontanous Breathing and Patient connected to face mask oxygen  Post-op Assessment: Report given to PACU RN  Post vital signs: Reviewed and stable  Complications: No apparent anesthesia complications

## 2013-02-07 NOTE — Op Note (Signed)
Pre Operative Diagnosis: Symptomatic gallstones  Post Operative Diagnosis: same  Surgeon: Dr. Axel Filler   Procedure: Lap chole with IOC  Assistant: none  Anesthesia: Gen. Endotracheal anesthesia   EBL: 5cc  Complications:  Counts: reported as correct x 2   Findings: The patient had large stone in her gallbladder with white bile  Indications for procedure: PT is a 37 y/o F with RUQ pain.  She was evaluated and seen to have a large gallstone at the neck of her gb.  She was seen in clinic and decided to have this elevtively removed.  Details of the procedure:  The patient was taken to the operating and placed in the supine position with bilateral SCDs in place. A time out was called and all facts were verified. A pneumoperitoneum was obtained via A Veress needle technique to a pressure of 14mm of mercury. A 5mm trochar was then placed in the right upper quadrant under visualization, and there were no injuries to any abdominal organs. A 11 mm port was then placed in the umbilical region after infiltrating with local anesthesia under direct visualization. A second and third epigastric port and right lower quadrant port placement under direct visualization, respectively. The gallbladder was identified and retracted, the peritoneum was then sharply dissected from the gallbladder and this dissection was carried down to Calot's triangle. The gallbladder was identified and stripped away circumferentially and seen going into the gallbladder 360, and the critical angle was obtained.  2 clips were placed proximally one distally and the cystic duct transected. The cystic artery was identified and 2 clips placed proximally and one distally and transected.  We then proceeded to remove the gallbladder off the hepatic fossa with Bovie cautery. A retieval bag was then placed in the abdomen and gallbladder placed in the bag. The hepatic fossa was then reexamined and hemostasis was achieved with Bovie  cautery and was excellent at the end of the case. The subhepatic fossa and perihepatic fossa was then irrigated until the effluent was clear. The 11 mm trocar fascia was reapproximated with the Endo Close #1 Vicryl x 2.  The pneumoperitoneum was evacuated and all trochars removed under direct visulalization.  The skin was then closed with 4-0 Monocryl and the skin dressed with Steri-Strips, gauze, and tape.  The patient was awaken from general anesthesia and taken to the recovery room in stable condition.

## 2013-02-07 NOTE — Anesthesia Postprocedure Evaluation (Signed)
Anesthesia Post Note  Patient: Tracie Hodge  Procedure(s) Performed: Procedure(s) (LRB): LAPAROSCOPIC CHOLECYSTECTOMY (N/A)  Anesthesia type: General  Patient location: PACU  Post pain: Pain level controlled  Post assessment: Patient's Cardiovascular Status Stable  Last Vitals:  Filed Vitals:   02/07/13 1606  BP:   Pulse:   Temp: 37.1 C  Resp:     Post vital signs: Reviewed and stable  Level of consciousness: alert  Complications: No apparent anesthesia complications

## 2013-02-07 NOTE — Preoperative (Signed)
Beta Blockers   Reason not to administer Beta Blockers:Not Applicable 

## 2013-02-09 ENCOUNTER — Encounter (HOSPITAL_COMMUNITY): Payer: Self-pay | Admitting: General Surgery

## 2013-02-12 ENCOUNTER — Encounter (INDEPENDENT_AMBULATORY_CARE_PROVIDER_SITE_OTHER): Payer: Self-pay

## 2013-02-12 ENCOUNTER — Telehealth (INDEPENDENT_AMBULATORY_CARE_PROVIDER_SITE_OTHER): Payer: Self-pay

## 2013-02-12 NOTE — Telephone Encounter (Signed)
V/M work note to return to work 02-14-13 at [employer]

## 2013-02-12 NOTE — Telephone Encounter (Signed)
Patient had G.B surgery 02-07-13, she is ready to return to work on 02/14/13. She feels she is doing good. If ok'd please leave work note at front desk.

## 2013-02-13 ENCOUNTER — Encounter (INDEPENDENT_AMBULATORY_CARE_PROVIDER_SITE_OTHER): Payer: Self-pay | Admitting: General Surgery

## 2013-02-13 NOTE — Telephone Encounter (Signed)
Called and spoke with pt and told her that a RTW note would be typed up and faxed to Attn:Jan Excell Seltzer  [phone] with confirm received per pt's req...confirm sent to med recd's to be scanned in...pt verbalized agreement with POC at this time

## 2013-02-19 ENCOUNTER — Encounter (INDEPENDENT_AMBULATORY_CARE_PROVIDER_SITE_OTHER): Payer: Medicaid Other | Admitting: General Surgery

## 2013-02-27 ENCOUNTER — Ambulatory Visit (INDEPENDENT_AMBULATORY_CARE_PROVIDER_SITE_OTHER): Payer: Medicaid Other | Admitting: General Surgery

## 2013-02-27 ENCOUNTER — Encounter (INDEPENDENT_AMBULATORY_CARE_PROVIDER_SITE_OTHER): Payer: Self-pay | Admitting: General Surgery

## 2013-02-27 VITALS — BP 116/70 | HR 68 | Temp 97.1°F | Resp 16 | Ht 63.0 in | Wt 244.2 lb

## 2013-02-27 DIAGNOSIS — Z9889 Other specified postprocedural states: Secondary | ICD-10-CM

## 2013-02-27 NOTE — Progress Notes (Signed)
Patient ID: Tracie Hodge, female   DOB: 01-07-1976, 37 y.o.   MRN: [id number] Post op course The patient is a 37 year old female status post laparoscopic cholecystectomy secondary to acute cholecystitis.  The patient has been doing well postoperatively. She's on normal diet. She is having normal bowel function. She does express some concern with her reflux.  On Exam: Wounds are clean dry and intact  Pathology:   Revealed chronic cholecystitis, cholelithiasis.  This was discussed with the patient.  Assessment and Plan 37 year old female status post laparoscopic cholecystectomy 1. This can follow up as needed 2. We discussed weightlifting restrictions for another month. 3. She can follow up with her PCP in regards to her reflux she discontinued.   Axel Filler, MD [facility] General & Minimally Invasive Surgery Trauma & Emergency Surgery

## 2014-05-01 ENCOUNTER — Ambulatory Visit: Payer: Self-pay

## 2014-05-15 ENCOUNTER — Ambulatory Visit: Payer: Self-pay
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
## 2014-08-26 LAB — SURGICAL PATHOLOGY

## 2015-11-24 ENCOUNTER — Emergency Department
Admission: EM | Admit: 2015-11-24 | Discharge: 2015-11-24 | Disposition: A | Discharge status: Home / Self Care | Payer: Medicaid Other | Attending: Emergency Medicine | Admitting: Emergency Medicine

## 2015-11-24 ENCOUNTER — Encounter: Payer: Self-pay | Admitting: Emergency Medicine

## 2015-11-24 DIAGNOSIS — Z79899 Other long term (current) drug therapy: Secondary | ICD-10-CM | POA: Insufficient documentation

## 2015-11-24 DIAGNOSIS — Z8719 Personal history of other diseases of the digestive system: Secondary | ICD-10-CM | POA: Insufficient documentation

## 2015-11-24 DIAGNOSIS — F419 Anxiety disorder, unspecified: Secondary | ICD-10-CM | POA: Insufficient documentation

## 2015-11-24 DIAGNOSIS — F17210 Nicotine dependence, cigarettes, uncomplicated: Secondary | ICD-10-CM | POA: Insufficient documentation

## 2015-11-24 DIAGNOSIS — J45909 Unspecified asthma, uncomplicated: Secondary | ICD-10-CM | POA: Insufficient documentation

## 2015-11-24 MED ORDER — LORAZEPAM 0.5 MG PO TABS
0.5000 mg | ORAL_TABLET | Freq: Once | ORAL | Status: AC
  Administered 2015-11-24: 0.5 mg via ORAL
  Filled 2015-11-24: qty 1

## 2015-11-24 MED ORDER — ESCITALOPRAM OXALATE 10 MG PO TABS: 10.0000 mg | ORAL_TABLET | Freq: Every day | ORAL | 0 refills | Status: DC

## 2015-11-24 NOTE — ED Notes (Signed)
See triage note. States her anxiety has increased since getting out of jail. She states that she can't see [facility] till 8/28. She states that she is overwhelmed with living situation. Pt tearful during assessment.

## 2015-11-24 NOTE — ED Notes (Signed)
Pt discharged home after verbalizing understanding of discharge instructions; nad noted. 

## 2015-11-24 NOTE — ED Provider Notes (Signed)
[facility]-EMERGENCY DEPARTMENT Provider Note   CSN: [id number] Arrival date & time: 11/24/15  1641  First Provider Contact:  First MD Initiated Contact with Patient 11/24/15 1701        History   Chief Complaint Chief Complaint  Patient presents with  . Anxiety    HPI Tracie Hodge is a 40 y.o. female.  Patient is a recently released from jail 2 months ago (parents and overwhelmed by living conditions and finances. Patient states she has history of anxiety and was on Klonopin before incarceration. Patient states since release from jail she is scheduled appointment with [facility], but will not be sent August 28. Patient denies any recent drug use or abuse. Patient also states she has mild depression but no suicidal ideations.     Anxiety     Past Medical History:  Diagnosis Date  . Anxiety   . Asthma   . Depression   . GERD (gastroesophageal reflux disease)   . Mental disorder   . Sleep apnea    sleep study done ? 3 years ago, does not use c-pap    There are no active problems to display for this patient.   Past Surgical History:  Procedure Laterality Date  . ABDOMINAL HYSTERECTOMY    . CESAREAN SECTION    . CHOLECYSTECTOMY N/A 02/07/2013   Procedure: LAPAROSCOPIC CHOLECYSTECTOMY;  Surgeon: Axel Filler, MD;  Location: [facility] OR;  Service: General;  Laterality: N/A;    OB History    No data available       Home Medications    Prior to Admission medications   Medication Sig Start Date End Date Taking? Authorizing Provider  buPROPion (WELLBUTRIN XL) 150 MG 24 hr tablet Take 150 mg by mouth daily. Take with 300 mg tablet for a 450 mg dose    Historical Provider, MD  buPROPion (WELLBUTRIN XL) 300 MG 24 hr tablet Take 300 mg by mouth daily. Take with 150 mg tablet for a 450 mg dose    Historical Provider, MD  ondansetron (ZOFRAN) 8 MG tablet Take 1 tablet (8 mg total) by mouth every 8 (eight) hours as needed for nausea. 01/29/13   Ruby Cola, PA-C    oxyCODONE-acetaminophen (PERCOCET) 10-325 MG per tablet Take 1 tablet by mouth every 4 (four) hours as needed for pain. 02/07/13   Axel Filler, MD  oxyCODONE-acetaminophen (PERCOCET/ROXICET) 5-325 MG per tablet Take 1 tablet by mouth daily as needed for pain.    Historical Provider, MD  promethazine (PHENERGAN) 25 MG tablet Take 25 mg by mouth every 6 (six) hours as needed for nausea.    Historical Provider, MD  ranitidine (ZANTAC) 150 MG tablet Take 150 mg by mouth at bedtime.    Historical Provider, MD  trazodone (DESYREL) 300 MG tablet Take 300 mg by mouth at bedtime.    Historical Provider, MD    Family History No family history on file.  Social History Social History  Substance Use Topics  . Smoking status: Current Every Day Smoker    Packs/day: 0.25    Types: Cigarettes  . Smokeless tobacco: Never Used  . Alcohol use No     Allergies   Review of patient's allergies indicates no known allergies.   Review of Systems Review of Systems  Constitutional: Negative.   HENT: Negative.   Eyes: Negative.   Respiratory: Negative.   Cardiovascular: Negative.   Gastrointestinal: Negative.   Endocrine: Negative.   Genitourinary: Negative.   Musculoskeletal: Negative.   Skin: Negative.  Neurological: Negative.   Hematological: Negative.   Psychiatric/Behavioral:       Patient states anxiety of intermittent depression. Patient denies suicidal ideations.     Physical Exam Updated Vital Signs BP 134/71 (BP Location: Left Arm)   Pulse 75   Temp 98.2 F (36.8 C) (Oral)   Resp 18   Ht  (1.6 m)   Wt 104.3 kg   SpO2 96%   BMI 40.74 kg/m   Physical Exam  Constitutional: She appears well-developed and well-nourished.  Psychiatric:  Patient is tearful. Thought content and judgment is normal.       ED Treatments / Results  Labs (all labs ordered are listed, but only abnormal results are displayed) Labs Reviewed - No data to display  EKG  EKG  Interpretation None       Radiology No results found.  Procedures Procedures (including critical care time)  Medications Ordered in ED Medications - No data to display   Initial Impression / Assessment and Plan / ED Course  I have reviewed the triage vital signs and the nursing notes.  Pertinent labs & imaging results that were available during my care of the patient were reviewed by me and considered in my medical decision making (see chart for details).  Clinical Course    Anxiety. Patient advised to keep her scheduled appointment with [facility] on 12/29/2015. Start Lexapro until further evaluation by [facility]. Final Clinical Impressions(s) / ED Diagnoses   Final diagnoses:  None    New Prescriptions New Prescriptions   No medications on file     Joni Reining, PA-C 11/24/15 1729    Rockne Menghini, MD 11/25/15 [phone]

## 2015-11-24 NOTE — ED Triage Notes (Signed)
Patient states she has been incarcerated for the past two years and currently staying with fiance and family.  Patient has anxiety and used to be on Klonopin.  Patient has appointment scheduled with [facility] for August 28th.  Patient denies SI/ HI  States "I just need something for my nerves".  Patient tearful in triage and cooperative and pleasant.

## 2015-12-17 ENCOUNTER — Emergency Department: Payer: Self-pay

## 2015-12-17 ENCOUNTER — Emergency Department
Admission: EM | Admit: 2015-12-17 | Discharge: 2015-12-17 | Disposition: A | Discharge status: Home / Self Care | Payer: Self-pay | Attending: Student | Admitting: Student

## 2015-12-17 DIAGNOSIS — Y999 Unspecified external cause status: Secondary | ICD-10-CM | POA: Insufficient documentation

## 2015-12-17 DIAGNOSIS — Y939 Activity, unspecified: Secondary | ICD-10-CM | POA: Insufficient documentation

## 2015-12-17 DIAGNOSIS — J45909 Unspecified asthma, uncomplicated: Secondary | ICD-10-CM | POA: Insufficient documentation

## 2015-12-17 DIAGNOSIS — X58XXXA Exposure to other specified factors, initial encounter: Secondary | ICD-10-CM | POA: Insufficient documentation

## 2015-12-17 DIAGNOSIS — Y929 Unspecified place or not applicable: Secondary | ICD-10-CM | POA: Insufficient documentation

## 2015-12-17 DIAGNOSIS — F17210 Nicotine dependence, cigarettes, uncomplicated: Secondary | ICD-10-CM | POA: Insufficient documentation

## 2015-12-17 DIAGNOSIS — S2232XA Fracture of one rib, left side, initial encounter for closed fracture: Secondary | ICD-10-CM | POA: Insufficient documentation

## 2015-12-17 MED ORDER — IBUPROFEN 600 MG PO TABS: 600.0000 mg | ORAL_TABLET | Freq: Three times a day (TID) | ORAL | 0 refills | Status: DC | PRN

## 2015-12-17 NOTE — ED Provider Notes (Signed)
[facility] Emergency Department Provider Note   ____________________________________________   First MD Initiated Contact with Patient 12/17/15 1431     (approximate)  I have reviewed the triage vital signs and the nursing notes.   HISTORY  Chief Complaint Other (rib pain)    HPI Tracie Hodge is a 40 y.o. female is here with complaint of left lateral rib pain. Patient is unaware of any injury. Patient states that the pain doubles her over when she is coughing or sneezing. She denies any previous injury to her ribs. She denies any upper respiratory symptoms read patient states she is a smoker at a half-pack seen as per day.   Past Medical History:  Diagnosis Date  . Anxiety   . Asthma   . Depression   . GERD (gastroesophageal reflux disease)   . Mental disorder   . Sleep apnea    sleep study done ? 3 years ago, does not use c-pap    There are no active problems to display for this patient.   Past Surgical History:  Procedure Laterality Date  . ABDOMINAL HYSTERECTOMY    . CESAREAN SECTION    . CHOLECYSTECTOMY N/A 02/07/2013   Procedure: LAPAROSCOPIC CHOLECYSTECTOMY;  Surgeon: Axel FillerArmando Ramirez, MD;  Location: [facility] OR;  Service: General;  Laterality: N/A;    Prior to Admission medications   Medication Sig Start Date End Date Taking? Authorizing Provider  buPROPion (WELLBUTRIN XL) 150 MG 24 hr tablet Take 150 mg by mouth daily. Take with 300 mg tablet for a 450 mg dose    Historical Provider, MD  buPROPion (WELLBUTRIN XL) 300 MG 24 hr tablet Take 300 mg by mouth daily. Take with 150 mg tablet for a 450 mg dose    Historical Provider, MD  escitalopram (LEXAPRO) 10 MG tablet Take 1 tablet (10 mg total) by mouth daily. 11/24/15 11/23/16  Joni Reiningonald K Smith, PA-C  ibuprofen (ADVIL,MOTRIN) 600 MG tablet Take 1 tablet (600 mg total) by mouth every 8 (eight) hours as needed. 12/17/15   Tommi Rumpshonda L Summers, PA-C  ondansetron (ZOFRAN) 8 MG tablet Take 1 tablet (8  mg total) by mouth every 8 (eight) hours as needed for nausea. 01/29/13   Ruby Colaatherine Schinlever, PA-C  oxyCODONE-acetaminophen (PERCOCET) 10-325 MG per tablet Take 1 tablet by mouth every 4 (four) hours as needed for pain. 02/07/13   Axel FillerArmando Ramirez, MD  oxyCODONE-acetaminophen (PERCOCET/ROXICET) 5-325 MG per tablet Take 1 tablet by mouth daily as needed for pain.    Historical Provider, MD  promethazine (PHENERGAN) 25 MG tablet Take 25 mg by mouth every 6 (six) hours as needed for nausea.    Historical Provider, MD  ranitidine (ZANTAC) 150 MG tablet Take 150 mg by mouth at bedtime.    Historical Provider, MD  traMADol (ULTRAM) 50 MG tablet Take 1 tablet (50 mg total) by mouth every 6 (six) hours as needed. 12/18/15   Jene Everyobert Kinner, MD  trazodone (DESYREL) 300 MG tablet Take 300 mg by mouth at bedtime.    Historical Provider, MD    Allergies Review of patient's allergies indicates no known allergies.  No family history on file.  Social History Social History  Substance Use Topics  . Smoking status: Current Every Day Smoker    Packs/day: 0.25    Types: Cigarettes  . Smokeless tobacco: Never Used  . Alcohol use No    Review of Systems Constitutional: No fever/chills Cardiovascular: Denies chest pain. Respiratory: Denies shortness of breath. Gastrointestinal: No abdominal pain.  No nausea, no vomiting.  Genitourinary: Negative for dysuria. Musculoskeletal: Negative for back pain.  Positive left rib pain. Skin: Negative for rash. Neurological: Negative for headaches, focal weakness or numbness.  10-point ROS otherwise negative.  ____________________________________________   PHYSICAL EXAM:  VITAL SIGNS: ED Triage Vitals [12/17/15 1405]  Enc Vitals Group     BP 139/76     Pulse Rate 64     Resp 17     Temp 97.8 F (36.6 C)     Temp Source Oral     SpO2 100 %     Weight 240 lb (108.9 kg)     Height 5\' 3"  (1.6 m)     Head Circumference      Peak Flow      Pain Score 8      Pain Loc      Pain Edu?      Excl. in GC?     Constitutional: Alert and oriented. Well appearing and in no acute distress. Eyes: Conjunctivae are normal. PERRL. EOMI. Head: Atraumatic. Nose: No congestion/rhinnorhea. Mouth/Throat: Mucous membranes are moist.  Oropharynx non-erythematous. Neck: No stridor.   Cardiovascular: Normal rate, regular rhythm. Grossly normal heart sounds.  Good peripheral circulation. Respiratory: Normal respiratory effort.  No retractions. Lungs CTAB. Gastrointestinal: Soft and nontender. No distention.  Musculoskeletal: There is tenderness on palpation of the left lateral ribs without deformity. There is no soft tissue swelling and there is no ecchymosis or abrasions seen. Patient moves upper and lower extremities without any difficulty and normal gait was noted. Neurologic:  Normal speech and language. No gross focal neurologic deficits are appreciated. No gait instability. Skin:  Skin is warm, dry and intact. No rash noted. Psychiatric: Mood and affect are normal. Speech and behavior are normal.  ____________________________________________   LABS (all labs ordered are listed, but only abnormal results are displayed)  Labs Reviewed - No data to display ____________________________________________   RADIOLOGY  Rib x-ray per radiologist  Subtle fracture anterior most aspect of left ninth rib. No  pneumothorax. Lungs clear.      PROCEDURES  Procedure(s) performed: None  Procedures  Critical Care performed: No  ____________________________________________   INITIAL IMPRESSION / ASSESSMENT AND PLAN / ED COURSE  Pertinent labs & imaging results that were available during my care of the patient were reviewed by me and considered in my medical decision making (see chart for details).    Clinical Course   Patient was made aware that she does have a fracture on the left. Patient was given a prescription for ibuprofen 600 mg 3 times a day  with food. Patient is to follow-up with her primary care doctor or [facility] if any continued problems.  ____________________________________________   FINAL CLINICAL IMPRESSION(S) / ED DIAGNOSES  Final diagnoses:  Left rib fracture, closed, initial encounter      NEW MEDICATIONS STARTED DURING THIS VISIT:  Discharge Medication List as of 12/17/2015  3:43 PM    START taking these medications   Details  ibuprofen (ADVIL,MOTRIN) 600 MG tablet Take 1 tablet (600 mg total) by mouth every 8 (eight) hours as needed., Starting Wed 12/17/2015, Print         Note:  This document was prepared using Dragon voice recognition software and may include unintentional dictation errors.    Tommi Rumpshonda L Summers, PA-C 12/20/15 1553    Gayla DossEryka A Gayle, MD 12/22/15 [phone]

## 2015-12-17 NOTE — ED Notes (Signed)
Instructions given to patient with rx for Motrin . Instructed patient how to take deep breaths with support to left side. Pt verbalizes that we did nothing for her and she would go somewhere else

## 2015-12-17 NOTE — ED Triage Notes (Signed)
Pt c/o left lateral rib pain for the past week, worse with movement and deep breathing.. Denies injury.

## 2015-12-18 ENCOUNTER — Encounter: Payer: Self-pay | Admitting: Emergency Medicine

## 2015-12-18 ENCOUNTER — Emergency Department
Admission: EM | Admit: 2015-12-18 | Discharge: 2015-12-18 | Disposition: A | Discharge status: Home / Self Care | Payer: Self-pay | Attending: Emergency Medicine | Admitting: Emergency Medicine

## 2015-12-18 DIAGNOSIS — Y999 Unspecified external cause status: Secondary | ICD-10-CM | POA: Insufficient documentation

## 2015-12-18 DIAGNOSIS — Z791 Long term (current) use of non-steroidal anti-inflammatories (NSAID): Secondary | ICD-10-CM | POA: Insufficient documentation

## 2015-12-18 DIAGNOSIS — Y929 Unspecified place or not applicable: Secondary | ICD-10-CM | POA: Insufficient documentation

## 2015-12-18 DIAGNOSIS — F17210 Nicotine dependence, cigarettes, uncomplicated: Secondary | ICD-10-CM | POA: Insufficient documentation

## 2015-12-18 DIAGNOSIS — J45909 Unspecified asthma, uncomplicated: Secondary | ICD-10-CM | POA: Insufficient documentation

## 2015-12-18 DIAGNOSIS — Z79899 Other long term (current) drug therapy: Secondary | ICD-10-CM | POA: Insufficient documentation

## 2015-12-18 DIAGNOSIS — Y939 Activity, unspecified: Secondary | ICD-10-CM | POA: Insufficient documentation

## 2015-12-18 DIAGNOSIS — S2232XD Fracture of one rib, left side, subsequent encounter for fracture with routine healing: Secondary | ICD-10-CM | POA: Insufficient documentation

## 2015-12-18 DIAGNOSIS — X58XXXD Exposure to other specified factors, subsequent encounter: Secondary | ICD-10-CM | POA: Insufficient documentation

## 2015-12-18 MED ORDER — TRAMADOL HCL 50 MG PO TABS: 50.0000 mg | ORAL_TABLET | Freq: Four times a day (QID) | ORAL | 0 refills | Status: DC | PRN

## 2015-12-18 NOTE — ED Notes (Signed)
See triage note..the patient sts that she tried to returned to work but was unable to due to pain.  Pt sts she was not able to sleep well, ibuprofen has not helped w/ pain.  Ambulatory to room w/o issue.

## 2015-12-18 NOTE — ED Provider Notes (Signed)
[facility] Emergency Department Provider Note   ____________________________________________    I have reviewed the triage vital signs and the nursing notes.   HISTORY  Chief Complaint Rib Fracture     HPI Tracie Hodge is a 40 y.o. female who presents with complaints of severe left-sided chest pain. She was diagnosed with a rib fracture yesterday in the emergency department and she reports the pain is unchanged. Sent home with a prescription for anti-inflammatory medication but she states it is not helping and she is unable to sleep. She denies shortness of breath. No fevers or chills. No new injuries.   Past Medical History:  Diagnosis Date  . Anxiety   . Asthma   . Depression   . GERD (gastroesophageal reflux disease)   . Mental disorder   . Sleep apnea    sleep study done ? 3 years ago, does not use c-pap    There are no active problems to display for this patient.   Past Surgical History:  Procedure Laterality Date  . ABDOMINAL HYSTERECTOMY    . CESAREAN SECTION    . CHOLECYSTECTOMY N/A 02/07/2013   Procedure: LAPAROSCOPIC CHOLECYSTECTOMY;  Surgeon: Axel FillerArmando Ramirez, MD;  Location: [facility] OR;  Service: General;  Laterality: N/A;    Prior to Admission medications   Medication Sig Start Date End Date Taking? Authorizing Provider  buPROPion (WELLBUTRIN XL) 150 MG 24 hr tablet Take 150 mg by mouth daily. Take with 300 mg tablet for a 450 mg dose    Historical Provider, MD  buPROPion (WELLBUTRIN XL) 300 MG 24 hr tablet Take 300 mg by mouth daily. Take with 150 mg tablet for a 450 mg dose    Historical Provider, MD  escitalopram (LEXAPRO) 10 MG tablet Take 1 tablet (10 mg total) by mouth daily. 11/24/15 11/23/16  Joni Reiningonald K Smith, PA-C  ibuprofen (ADVIL,MOTRIN) 600 MG tablet Take 1 tablet (600 mg total) by mouth every 8 (eight) hours as needed. 12/17/15   Tommi Rumpshonda L Summers, PA-C  ondansetron (ZOFRAN) 8 MG tablet Take 1 tablet (8 mg total) by mouth  every 8 (eight) hours as needed for nausea. 01/29/13   Ruby Colaatherine Schinlever, PA-C  oxyCODONE-acetaminophen (PERCOCET) 10-325 MG per tablet Take 1 tablet by mouth every 4 (four) hours as needed for pain. 02/07/13   Axel FillerArmando Ramirez, MD  oxyCODONE-acetaminophen (PERCOCET/ROXICET) 5-325 MG per tablet Take 1 tablet by mouth daily as needed for pain.    Historical Provider, MD  promethazine (PHENERGAN) 25 MG tablet Take 25 mg by mouth every 6 (six) hours as needed for nausea.    Historical Provider, MD  ranitidine (ZANTAC) 150 MG tablet Take 150 mg by mouth at bedtime.    Historical Provider, MD  traMADol (ULTRAM) 50 MG tablet Take 1 tablet (50 mg total) by mouth every 6 (six) hours as needed. 12/18/15   Jene Everyobert Khristi Schiller, MD  trazodone (DESYREL) 300 MG tablet Take 300 mg by mouth at bedtime.    Historical Provider, MD     Allergies Review of patient's allergies indicates no known allergies.  No family history on file.  Social History Social History  Substance Use Topics  . Smoking status: Current Every Day Smoker    Packs/day: 0.25    Types: Cigarettes  . Smokeless tobacco: Never Used  . Alcohol use No    Review of Systems  Constitutional: No fever/chills     Gastrointestinal: No abdominal pain.  No nausea, no vomiting.    Musculoskeletal: Chest wall  pain. Skin: Negative for rash.     ____________________________________________   PHYSICAL EXAM:  VITAL SIGNS: ED Triage Vitals  Enc Vitals Group     BP 12/18/15 1120 (!) 156/90     Pulse Rate 12/18/15 1120 65     Resp 12/18/15 1120 16     Temp 12/18/15 1120 97.8 F (36.6 C)     Temp Source 12/18/15 1120 Oral     SpO2 12/18/15 1120 100 %     Weight 12/18/15 1120 240 lb (108.9 kg)     Height 12/18/15 1120 5\' 3"  (1.6 m)     Head Circumference --      Peak Flow --      Pain Score 12/18/15 1121 9     Pain Loc --      Pain Edu? --      Excl. in GC? --      Constitutional: Alert and oriented. No acute distress. Pleasant and  interactive Eyes: Conjunctivae are normal.  Head: Atraumatic. Nose: No congestion/rhinnorhea. Mouth/Throat: Mucous membranes are moist.   Cardiovascular: Normal rate, regular rhythm. Significant tenderness palpation of the left lateral inferior chest wall, no bruising or swelling noted. No bony abnormalities felt. Respiratory: Normal respiratory effort.  No retractions. Genitourinary: deferred  Neurologic:  Normal speech and language. No gross focal neurologic deficits are appreciated.   Skin:  Skin is warm, dry and intact. No rash noted.   ____________________________________________   LABS (all labs ordered are listed, but only abnormal results are displayed)  Labs Reviewed - No data to display ____________________________________________  EKG   ____________________________________________  RADIOLOGY  Reviewed yesterday's films which demonstrated a subtle rib fracture ____________________________________________   PROCEDURES  Procedure(s) performed:No    Critical Care performed: No ____________________________________________   INITIAL IMPRESSION / ASSESSMENT AND PLAN / ED COURSE  Pertinent labs & imaging results that were available during my care of the patient were reviewed by me and considered in my medical decision making (see chart for details).  We will provide patient with stronger prescription analgesic. She agrees with this plan.   ____________________________________________   FINAL CLINICAL IMPRESSION(S) / ED DIAGNOSES  Final diagnoses:  Rib fracture, left, with routine healing, subsequent encounter      NEW MEDICATIONS STARTED DURING THIS VISIT:  Discharge Medication List as of 12/18/2015 11:57 AM    START taking these medications   Details  traMADol (ULTRAM) 50 MG tablet Take 1 tablet (50 mg total) by mouth every 6 (six) hours as needed., Starting Thu 12/18/2015, Print         Note:  This document was prepared using Dragon voice  recognition software and may include unintentional dictation errors.    Jene Everyobert Avari Gelles, MD 12/18/15 1242

## 2015-12-18 NOTE — ED Triage Notes (Signed)
C/O left rib pain.  Seen through ED yesterday and diagnosed with rib fracture.  .Marland Kitchen

## 2016-01-21 ENCOUNTER — Encounter: Payer: Self-pay | Admitting: Intensive Care

## 2016-01-21 ENCOUNTER — Emergency Department
Admission: EM | Admit: 2016-01-21 | Discharge: 2016-01-21 | Disposition: A | Discharge status: Home / Self Care | Payer: Self-pay | Attending: Emergency Medicine | Admitting: Emergency Medicine

## 2016-01-21 DIAGNOSIS — F17210 Nicotine dependence, cigarettes, uncomplicated: Secondary | ICD-10-CM | POA: Insufficient documentation

## 2016-01-21 DIAGNOSIS — Z79899 Other long term (current) drug therapy: Secondary | ICD-10-CM | POA: Insufficient documentation

## 2016-01-21 DIAGNOSIS — R0781 Pleurodynia: Secondary | ICD-10-CM | POA: Insufficient documentation

## 2016-01-21 DIAGNOSIS — J45909 Unspecified asthma, uncomplicated: Secondary | ICD-10-CM | POA: Insufficient documentation

## 2016-01-21 MED ORDER — HYDROCODONE-ACETAMINOPHEN 5-325 MG PO TABS: 1.0000 | ORAL_TABLET | Freq: Four times a day (QID) | ORAL | 0 refills | Status: DC | PRN

## 2016-01-21 MED ORDER — CYCLOBENZAPRINE HCL 10 MG PO TABS: 10.0000 mg | ORAL_TABLET | Freq: Three times a day (TID) | ORAL | 0 refills | Status: DC | PRN

## 2016-01-21 NOTE — ED Triage Notes (Signed)
Pt reports "I was here a few weeks ago and diagnosed with L sided fractured rib. I was picking up a heavy box at work and I think I re injured it. I took Ibuprofen this morning with no relief" Pt ambulatory in triage with NAD noted. Pt denies chest pain or SOB. Pt c/o L sided rib pain and cannot get relief at home

## 2016-01-21 NOTE — ED Provider Notes (Signed)
[facility] Emergency Department Provider Note ____________________________________________  Time seen: Approximately 11:07 AM  I have reviewed the triage vital signs and the nursing notes.   HISTORY  Chief Complaint No chief complaint on file.    HPI Tracie Hodge is a 40 y.o. female presents to the emergency department for evaluation of left rib pain. She states she was diagnosed with a rib fractureon 12/17/2015. She was prescribed tramadol and taken out of work for 2 days, which gave her some relief. She states that she went back to work yesterday, and her ribs are now very painful. 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 She is out of tramadol. She's had no relief with ibuprofen.  Past Medical History:  Diagnosis Date  . Anxiety   . Asthma   . Depression   . GERD (gastroesophageal reflux disease)   . Mental disorder   . Sleep apnea    sleep study done ? 3 years ago, does not use c-pap    There are no active problems to display for this patient.   Past Surgical History:  Procedure Laterality Date  . ABDOMINAL HYSTERECTOMY    . CESAREAN SECTION    . CHOLECYSTECTOMY N/A 02/07/2013   Procedure: LAPAROSCOPIC CHOLECYSTECTOMY;  Surgeon: Axel FillerArmando Ramirez, MD;  Location: [facility] OR;  Service: General;  Laterality: N/A;    Prior to Admission medications   Medication Sig Start Date End Date Taking? Authorizing Provider  buPROPion (WELLBUTRIN XL) 150 MG 24 hr tablet Take 150 mg by mouth daily. Take with 300 mg tablet for a 450 mg dose    Historical Provider, MD  buPROPion (WELLBUTRIN XL) 300 MG 24 hr tablet Take 300 mg by mouth daily. Take with 150 mg tablet for a 450 mg dose    Historical Provider, MD  cyclobenzaprine (FLEXERIL) 10 MG tablet Take 1 tablet (10 mg total) by mouth 3 (three) times daily as needed for muscle spasms. 01/21/16   Chinita Pesterari B Ariany Kesselman, FNP  escitalopram (LEXAPRO) 10 MG tablet Take  1 tablet (10 mg total) by mouth daily. 11/24/15 11/23/16  Joni Reiningonald K Smith, PA-C  HYDROcodone-acetaminophen (NORCO/VICODIN) 5-325 MG tablet Take 1 tablet by mouth every 6 (six) hours as needed for moderate pain. 01/21/16 01/20/17  Chinita Pesterari B Aly Seidenberg, FNP  ibuprofen (ADVIL,MOTRIN) 600 MG tablet Take 1 tablet (600 mg total) by mouth every 8 (eight) hours as needed. 12/17/15   Tommi Rumpshonda L Summers, PA-C  ondansetron (ZOFRAN) 8 MG tablet Take 1 tablet (8 mg total) by mouth every 8 (eight) hours as needed for nausea. 01/29/13   Ruby Colaatherine Schinlever, PA-C  promethazine (PHENERGAN) 25 MG tablet Take 25 mg by mouth every 6 (six) hours as needed for nausea.    Historical Provider, MD  ranitidine (ZANTAC) 150 MG tablet Take 150 mg by mouth at bedtime.    Historical Provider, MD  trazodone (DESYREL) 300 MG tablet Take 300 mg by mouth at bedtime.    Historical Provider, MD    Allergies Review of patient's allergies indicates no known allergies.  History reviewed. No pertinent family history.  Social History Social History  Substance Use Topics  . Smoking status: Current Every Day Smoker    Packs/day: 0.50    Types: Cigarettes  . Smokeless tobacco: Never Used  . Alcohol use No    Review of Systems Constitutional: No recent illness. Cardiovascular: Denies chest pain or palpitations. Respiratory: Denies shortness of breath. Musculoskeletal: Pain  in Left rib Skin: Negative for rash, wound, lesion. Neurological: Negative for focal weakness or numbness.  ____________________________________________   PHYSICAL EXAM:  VITAL SIGNS: ED Triage Vitals  Enc Vitals Group     BP 01/21/16 1026 126/63     Pulse Rate 01/21/16 1026 64     Resp 01/21/16 1026 18     Temp 01/21/16 1026 98.6 F (37 C)     Temp Source 01/21/16 1026 Oral     SpO2 01/21/16 1026 97 %     Weight 01/21/16 1028 240 lb (108.9 kg)     Height 01/21/16 1028 5\' 3"  (1.6 m)     Head Circumference --      Peak Flow --      Pain Score 01/21/16  1028 8     Pain Loc --      Pain Edu? --      Excl. in GC? --     Constitutional: Alert and oriented. Well appearing and in no acute distress. Eyes: Conjunctivae are normal. EOMI. Head: Atraumatic. Neck: No stridor.  Respiratory: Normal respiratory effort.  Lungs clear to auscultation Musculoskeletal: Tenderness to the upper, left lateral thorax. No flail chest or obvious deformities. Neurologic:  Normal speech and language. No gross focal neurologic deficits are appreciated. Speech is normal. No gait instability. Skin:  Skin is warm, dry and intact. Atraumatic. Psychiatric: Mood and affect are normal. Speech and behavior are normal.  ____________________________________________   LABS (all labs ordered are listed, but only abnormal results are displayed)  Labs Reviewed - No data to display ____________________________________________  RADIOLOGY  Not indicated ____________________________________________   PROCEDURES  Procedure(s) performed: None   ____________________________________________   INITIAL IMPRESSION / ASSESSMENT AND PLAN / ED COURSE  Clinical Course    Pertinent labs & imaging results that were available during my care of the patient were reviewed by me and considered in my medical decision making (see chart for details).  Patient will be given prescriptions for 12 tablets of Norco. She will also be given a prescription for Flexeril. She was encouraged to ice the sore area. She was encouraged to follow up with her primary care provider for symptoms that are not improving over the next couple weeks. She was encouraged to return to emergency by for symptoms that change or worsen if she is unable see her primary care provider. ____________________________________________   FINAL CLINICAL IMPRESSION(S) / ED DIAGNOSES  Final diagnoses:  Rib pain on left side       Chinita Pester, FNP 01/21/16 1121    Sharyn Creamer, MD 01/21/16 1540

## 2016-01-21 NOTE — ED Notes (Signed)
Pt informed to return if any life threatening symptoms occur.  

## 2016-01-21 NOTE — ED Notes (Signed)
Pt states left sided rib cage pain, states she was seen in ED recently and was told she had a fractured rib, states she needs something for pain, pt awake and alert in no distress

## 2016-02-21 ENCOUNTER — Emergency Department: Payer: No Typology Code available for payment source

## 2016-02-21 ENCOUNTER — Emergency Department
Admission: EM | Admit: 2016-02-21 | Discharge: 2016-02-21 | Disposition: A | Discharge status: Home / Self Care | Payer: No Typology Code available for payment source | Attending: Emergency Medicine | Admitting: Emergency Medicine

## 2016-02-21 DIAGNOSIS — J4521 Mild intermittent asthma with (acute) exacerbation: Secondary | ICD-10-CM | POA: Insufficient documentation

## 2016-02-21 DIAGNOSIS — R05 Cough: Secondary | ICD-10-CM | POA: Diagnosis present

## 2016-02-21 DIAGNOSIS — J45909 Unspecified asthma, uncomplicated: Secondary | ICD-10-CM | POA: Insufficient documentation

## 2016-02-21 DIAGNOSIS — Z79899 Other long term (current) drug therapy: Secondary | ICD-10-CM | POA: Insufficient documentation

## 2016-02-21 DIAGNOSIS — J069 Acute upper respiratory infection, unspecified: Secondary | ICD-10-CM

## 2016-02-21 DIAGNOSIS — Z791 Long term (current) use of non-steroidal anti-inflammatories (NSAID): Secondary | ICD-10-CM | POA: Insufficient documentation

## 2016-02-21 DIAGNOSIS — F17210 Nicotine dependence, cigarettes, uncomplicated: Secondary | ICD-10-CM | POA: Diagnosis not present

## 2016-02-21 MED ORDER — IPRATROPIUM-ALBUTEROL 0.5-2.5 (3) MG/3ML IN SOLN
3.0000 mL | Freq: Once | RESPIRATORY_TRACT | Status: AC
  Administered 2016-02-21: 3 mL via RESPIRATORY_TRACT
  Filled 2016-02-21: qty 3

## 2016-02-21 MED ORDER — PREDNISONE 20 MG PO TABS
60.0000 mg | ORAL_TABLET | Freq: Once | ORAL | Status: AC
  Administered 2016-02-21: 60 mg via ORAL
  Filled 2016-02-21: qty 3

## 2016-02-21 MED ORDER — PREDNISONE 10 MG (21) PO TBPK: ORAL_TABLET | ORAL | 0 refills | Status: DC

## 2016-02-21 MED ORDER — ALBUTEROL SULFATE HFA 108 (90 BASE) MCG/ACT IN AERS: 2.0000 | INHALATION_SPRAY | Freq: Four times a day (QID) | RESPIRATORY_TRACT | 2 refills | Status: DC | PRN

## 2016-02-21 NOTE — ED Provider Notes (Signed)
[facility] Emergency Department Provider Note        Time seen: ----------------------------------------- 1:10 PM on 02/21/2016 -----------------------------------------    I have reviewed the triage vital signs and the nursing notes.   HISTORY  Chief Complaint Cough    HPI Tracie Hodge is a 40 y.o. female who presents to the ER for nasal congestion and cough with no sputum production over the last week. Patient denies fever, she's been using over-the-counter medications without improvement. She has a history of asthma and is also asking for refill of her asthma inhaler. She denies any other complaints.   Past Medical History:  Diagnosis Date  . Anxiety   . Asthma   . Depression   . GERD (gastroesophageal reflux disease)   . Mental disorder   . Sleep apnea    sleep study done ? 3 years ago, does not use c-pap    There are no active problems to display for this patient.   Past Surgical History:  Procedure Laterality Date  . ABDOMINAL HYSTERECTOMY    . CESAREAN SECTION    . CHOLECYSTECTOMY N/A 02/07/2013   Procedure: LAPAROSCOPIC CHOLECYSTECTOMY;  Surgeon: Axel Filler, MD;  Location: [facility] OR;  Service: General;  Laterality: N/A;    Allergies Review of patient's allergies indicates no known allergies.  Social History Social History  Substance Use Topics  . Smoking status: Current Every Day Smoker    Packs/day: 0.50    Types: Cigarettes  . Smokeless tobacco: Never Used  . Alcohol use No    Review of Systems Constitutional: Negative for fever. Cardiovascular: Negative for chest pain. Respiratory: Positive for shortness of breath and cough with congestion Gastrointestinal: Negative for abdominal pain, vomiting and diarrhea. Musculoskeletal: Negative for back pain. Skin: Negative for rash. Neurological: Negative for headaches, focal weakness or numbness.  10-point ROS otherwise  negative.  ____________________________________________   PHYSICAL EXAM:  VITAL SIGNS: ED Triage Vitals  Enc Vitals Group     BP 02/21/16 1303 (!) 125/47     Pulse Rate 02/21/16 1303 75     Resp 02/21/16 1303 20     Temp 02/21/16 1303 98.3 F (36.8 C)     Temp Source 02/21/16 1303 Oral     SpO2 02/21/16 1303 97 %     Weight 02/21/16 1303 240 lb (108.9 kg)     Height 02/21/16 1303 5\' 3"  (1.6 m)     Head Circumference --      Peak Flow --      Pain Score 02/21/16 1306 7     Pain Loc --      Pain Edu? --      Excl. in GC? --    Constitutional: Alert and oriented. Well appearing and in no distress. Eyes: Conjunctivae are normal. PERRL. Normal extraocular movements. ENT   Head: Normocephalic and atraumatic.   Nose: No congestion/rhinnorhea.   Mouth/Throat: Mucous membranes are moist.   Neck: No stridor. Cardiovascular: Normal rate, regular rhythm. No murmurs, rubs, or gallops. Respiratory: Normal respiratory effort without tachypnea nor retractions.Mild to moderate wheezing bilaterally Musculoskeletal: Nontender with normal range of motion in all extremities. No lower extremity tenderness nor edema. Neurologic:  Normal speech and language. No gross focal neurologic deficits are appreciated.  Skin:  Skin is warm, dry and intact. No rash noted. Psychiatric: Mood and affect are normal. Speech and behavior are normal.  ____________________________________________  ED COURSE:  Pertinent labs & imaging results that were available during my care of the  patient were reviewed by me and considered in my medical decision making (see chart for details). Clinical Course  Patient presents to the ER with cough and congestion. She will receive a DuoNeb and steroids. We will assess her lungs and a chest x-ray due to persistent cough.  Procedures ____________________________________________   RADIOLOGY Images were viewed by me  Chest x-ray is  unremarkable  ____________________________________________  FINAL ASSESSMENT AND PLAN  URI, asthma exacerbation  Plan: Patient with imaging as dictated above. Patient is improved with DuoNeb since steroids. She'll be discharged with steroid taper as well as albuterol and encouraged to have close outpatient follow-up with her doctor.   Marlo FilbertWilliams, Guyla Bless E, MD   Note: This dictation was prepared with Dragon dictation. Any transcriptional errors that result from this process are unintentional    Ezella FilbertJonathan E Kamilla Hands, MD 02/21/16 1316

## 2016-02-21 NOTE — ED Triage Notes (Addendum)
Pt reports for past week nasal and chest congestion with dry cough. Denies fever. States she has been using ibuprofen and cold medication. Reports Hx of  asthma and needs a refill of her rescue inhaler.

## 2016-02-21 NOTE — ED Notes (Signed)
E signature pad not working 

## 2016-05-08 ENCOUNTER — Encounter: Payer: Self-pay | Admitting: Emergency Medicine

## 2016-05-08 ENCOUNTER — Emergency Department: Payer: No Typology Code available for payment source

## 2016-05-08 ENCOUNTER — Emergency Department
Admission: EM | Admit: 2016-05-08 | Discharge: 2016-05-08 | Disposition: A | Discharge status: Home / Self Care | Payer: No Typology Code available for payment source | Attending: Emergency Medicine | Admitting: Emergency Medicine

## 2016-05-08 DIAGNOSIS — M79672 Pain in left foot: Secondary | ICD-10-CM | POA: Diagnosis present

## 2016-05-08 DIAGNOSIS — Z791 Long term (current) use of non-steroidal anti-inflammatories (NSAID): Secondary | ICD-10-CM | POA: Diagnosis not present

## 2016-05-08 DIAGNOSIS — J45909 Unspecified asthma, uncomplicated: Secondary | ICD-10-CM | POA: Diagnosis not present

## 2016-05-08 DIAGNOSIS — G5762 Lesion of plantar nerve, left lower limb: Secondary | ICD-10-CM | POA: Insufficient documentation

## 2016-05-08 DIAGNOSIS — Z79899 Other long term (current) drug therapy: Secondary | ICD-10-CM | POA: Diagnosis not present

## 2016-05-08 DIAGNOSIS — F418 Other specified anxiety disorders: Secondary | ICD-10-CM | POA: Insufficient documentation

## 2016-05-08 DIAGNOSIS — F17210 Nicotine dependence, cigarettes, uncomplicated: Secondary | ICD-10-CM | POA: Diagnosis not present

## 2016-05-08 MED ORDER — NAPROXEN 500 MG PO TABS
500.0000 mg | ORAL_TABLET | Freq: Once | ORAL | Status: AC
  Administered 2016-05-08: 500 mg via ORAL
  Filled 2016-05-08: qty 1

## 2016-05-08 MED ORDER — TRAMADOL HCL 50 MG PO TABS
50.0000 mg | ORAL_TABLET | Freq: Once | ORAL | Status: DC
  Filled 2016-05-08: qty 1

## 2016-05-08 MED ORDER — NAPROXEN 500 MG PO TABS
500.0000 mg | ORAL_TABLET | Freq: Two times a day (BID) | ORAL | Status: DC
Start: 2016-05-08 — End: 2016-08-13

## 2016-05-08 MED ORDER — OXYCODONE-ACETAMINOPHEN 7.5-325 MG PO TABS: 1.0000 | ORAL_TABLET | ORAL | 0 refills | Status: DC | PRN

## 2016-05-08 MED ORDER — TRAMADOL HCL 50 MG PO TABS
ORAL_TABLET | ORAL | Status: AC
  Filled 2016-05-08: qty 1

## 2016-05-08 MED ORDER — TRAMADOL HCL 50 MG PO TABS
50.0000 mg | ORAL_TABLET | Freq: Once | ORAL | Status: AC
  Administered 2016-05-08: 50 mg via ORAL

## 2016-05-08 NOTE — ED Triage Notes (Signed)
Painful lump bottom of L foot x 2 weeks, denies injury.

## 2016-05-08 NOTE — ED Provider Notes (Signed)
[facility] Emergency Department Provider Note   ____________________________________________   First MD Initiated Contact with Patient 05/08/16 1053     (approximate)  I have reviewed the triage vital signs and the nursing notes.   HISTORY  Chief Complaint Foot Pain    HPI Tracie Hodge is a 41 y.o. female patient with left plantar foot pain for 2 weeks. Paced the pain has increased so she is told standing. Patient rates the pain as 8/10. Patient described a pain as "sharp". Patient her job requires prolonged standing. No palliative measures taken for this complaint. No other provocative incident for her pain.   Past Medical History:  Diagnosis Date  . Anxiety   . Asthma   . Depression   . GERD (gastroesophageal reflux disease)   . Mental disorder   . Sleep apnea    sleep study done ? 3 years ago, does not use c-pap    There are no active problems to display for this patient.   Past Surgical History:  Procedure Laterality Date  . ABDOMINAL HYSTERECTOMY    . CESAREAN SECTION    . CHOLECYSTECTOMY N/A 02/07/2013   Procedure: LAPAROSCOPIC CHOLECYSTECTOMY;  Surgeon: Axel Filler, MD;  Location: [facility] OR;  Service: General;  Laterality: N/A;    Prior to Admission medications   Medication Sig Start Date End Date Taking? Authorizing Provider  albuterol (PROVENTIL HFA;VENTOLIN HFA) 108 (90 Base) MCG/ACT inhaler Inhale 2 puffs into the lungs every 6 (six) hours as needed for wheezing or shortness of breath. 02/21/16   Tyia Filbert, MD  buPROPion (WELLBUTRIN XL) 150 MG 24 hr tablet Take 150 mg by mouth daily. Take with 300 mg tablet for a 450 mg dose    Historical Provider, MD  buPROPion (WELLBUTRIN XL) 300 MG 24 hr tablet Take 300 mg by mouth daily. Take with 150 mg tablet for a 450 mg dose    Historical Provider, MD  cyclobenzaprine (FLEXERIL) 10 MG tablet Take 1 tablet (10 mg total) by mouth 3 (three) times daily as needed for muscle spasms.  01/21/16   Chinita Pester, FNP  escitalopram (LEXAPRO) 10 MG tablet Take 1 tablet (10 mg total) by mouth daily. 11/24/15 11/23/16  Joni Reining, PA-C  HYDROcodone-acetaminophen (NORCO/VICODIN) 5-325 MG tablet Take 1 tablet by mouth every 6 (six) hours as needed for moderate pain. 01/21/16 01/20/17  Chinita Pester, FNP  ibuprofen (ADVIL,MOTRIN) 600 MG tablet Take 1 tablet (600 mg total) by mouth every 8 (eight) hours as needed. 12/17/15   Tommi Rumps, PA-C  naproxen (NAPROSYN) 500 MG tablet Take 1 tablet (500 mg total) by mouth 2 (two) times daily with a meal. 05/08/16   Joni Reining, PA-C  ondansetron (ZOFRAN) 8 MG tablet Take 1 tablet (8 mg total) by mouth every 8 (eight) hours as needed for nausea. 01/29/13   Ruby Cola, PA-C  oxyCODONE-acetaminophen (PERCOCET) 7.5-325 MG tablet Take 1 tablet by mouth every 4 (four) hours as needed for severe pain. 05/08/16   Joni Reining, PA-C  predniSONE (STERAPRED UNI-PAK 21 TAB) 10 MG (21) TBPK tablet Dispense steroid taper pack as directed 02/21/16   Briani Filbert, MD  promethazine (PHENERGAN) 25 MG tablet Take 25 mg by mouth every 6 (six) hours as needed for nausea.    Historical Provider, MD  ranitidine (ZANTAC) 150 MG tablet Take 150 mg by mouth at bedtime.    Historical Provider, MD  trazodone (DESYREL) 300 MG tablet Take 300  mg by mouth at bedtime.    Historical Provider, MD    Allergies Patient has no known allergies.  No family history on file.  Social History Social History  Substance Use Topics  . Smoking status: Current Every Day Smoker    Packs/day: 0.50    Types: Cigarettes  . Smokeless tobacco: Never Used  . Alcohol use No    Review of Systems Constitutional: No fever/chills Eyes: No visual changes. ENT: No sore throat. Cardiovascular: Denies chest pain. Respiratory: Denies shortness of breath. Gastrointestinal: No abdominal pain.  No nausea, no vomiting.  No diarrhea.  No constipation. Genitourinary: Negative  for dysuria. Musculoskeletal: Left plantar foot pain  Skin: Negative for rash. Neurological: Negative for headaches, focal weakness or numbness. Psychiatric: Anxiety and depression ____________________________________________   PHYSICAL EXAM:  VITAL SIGNS: ED Triage Vitals  Enc Vitals Group     BP 05/08/16 0940 (!) 148/77     Pulse Rate 05/08/16 0940 73     Resp 05/08/16 0940 18     Temp 05/08/16 0940 98.1 F (36.7 C)     Temp Source 05/08/16 0940 Oral     SpO2 05/08/16 0940 99 %     Weight 05/08/16 0941 240 lb (108.9 kg)     Height 05/08/16 0941 5\' 3"  (1.6 m)     Head Circumference --      Peak Flow --      Pain Score 05/08/16 0941 8     Pain Loc --      Pain Edu? --      Excl. in GC? --     Constitutional: Alert and oriented. Well appearing and in no acute distress. Eyes: Conjunctivae are normal. PERRL. EOMI. Head: Atraumatic. Nose: No congestion/rhinnorhea. Mouth/Throat: Mucous membranes are moist.  Oropharynx non-erythematous. Neck: No stridor.  No cervical spine tenderness to palpation. Hematological/Lymphatic/Immunilogical: No cervical lymphadenopathy. Cardiovascular: Normal rate, regular rhythm. Grossly normal heart sounds.  Good peripheral circulation. Respiratory: Normal respiratory effort.  No retractions. Lungs CTAB. Gastrointestinal: Soft and nontender. No distention. No abdominal bruits. No CVA tenderness. Musculoskeletal: No lower extremity tenderness nor edema.  No joint effusions.Increased guarding with palpation the nausea lesion between the second and third metatarsal plantar aspect. Neurologic:  Normal speech and language. No gross focal neurologic deficits are appreciated. No gait instability. Skin:  Skin is warm, dry and intact. No rash noted. Psychiatric: Mood and affect are normal. Speech and behavior are normal.  ____________________________________________   LABS (all labs ordered are listed, but only abnormal results are displayed)  Labs  Reviewed - No data to display ____________________________________________  EKG   ____________________________________________  RADIOLOGY  No acute findings on x-ray of the left foot. ____________________________________________   PROCEDURES  Procedure(s) performed: None  Procedures  Critical Care performed: No  ____________________________________________   INITIAL IMPRESSION / ASSESSMENT AND PLAN / ED COURSE  Pertinent labs & imaging results that were available during my care of the patient were reviewed by me and considered in my medical decision making (see chart for details).  Morton neuroma of the left foot. Patient given discharge Instructions. Patient advised follow-up with podiatry for definitive evaluation and treatment. Patient given a work note. Patient prescription for Percocets and naproxen.  Clinical Course      ____________________________________________   FINAL CLINICAL IMPRESSION(S) / ED DIAGNOSES  Final diagnoses:  Morton neuroma, left      NEW MEDICATIONS STARTED DURING THIS VISIT:  New Prescriptions   NAPROXEN (NAPROSYN) 500 MG TABLET    Take  1 tablet (500 mg total) by mouth 2 (two) times daily with a meal.   OXYCODONE-ACETAMINOPHEN (PERCOCET) 7.5-325 MG TABLET    Take 1 tablet by mouth every 4 (four) hours as needed for severe pain.     Note:  This document was prepared using Dragon voice recognition software and may include unintentional dictation errors.    Joni Reiningonald K Shakil Dirk, PA-C 05/08/16 1148    Phineas SemenGraydon Goodman, MD 05/08/16 1210

## 2016-05-23 ENCOUNTER — Emergency Department
Admission: EM | Admit: 2016-05-23 | Discharge: 2016-05-24 | Disposition: A | Discharge status: Home / Self Care | Payer: No Typology Code available for payment source | Attending: Emergency Medicine | Admitting: Emergency Medicine

## 2016-05-23 ENCOUNTER — Encounter: Payer: Self-pay | Admitting: Emergency Medicine

## 2016-05-23 ENCOUNTER — Emergency Department: Payer: No Typology Code available for payment source

## 2016-05-23 DIAGNOSIS — Y939 Activity, unspecified: Secondary | ICD-10-CM | POA: Diagnosis not present

## 2016-05-23 DIAGNOSIS — Z79899 Other long term (current) drug therapy: Secondary | ICD-10-CM | POA: Insufficient documentation

## 2016-05-23 DIAGNOSIS — S0990XA Unspecified injury of head, initial encounter: Secondary | ICD-10-CM | POA: Diagnosis present

## 2016-05-23 DIAGNOSIS — F17210 Nicotine dependence, cigarettes, uncomplicated: Secondary | ICD-10-CM | POA: Insufficient documentation

## 2016-05-23 DIAGNOSIS — Y929 Unspecified place or not applicable: Secondary | ICD-10-CM | POA: Diagnosis not present

## 2016-05-23 DIAGNOSIS — Z791 Long term (current) use of non-steroidal anti-inflammatories (NSAID): Secondary | ICD-10-CM | POA: Insufficient documentation

## 2016-05-23 DIAGNOSIS — J45909 Unspecified asthma, uncomplicated: Secondary | ICD-10-CM | POA: Insufficient documentation

## 2016-05-23 DIAGNOSIS — S0121XA Laceration without foreign body of nose, initial encounter: Secondary | ICD-10-CM | POA: Insufficient documentation

## 2016-05-23 DIAGNOSIS — Y999 Unspecified external cause status: Secondary | ICD-10-CM | POA: Insufficient documentation

## 2016-05-23 MED ORDER — SODIUM CHLORIDE 0.9 % IV BOLUS (SEPSIS)
1000.0000 mL | Freq: Once | INTRAVENOUS | Status: AC
Start: 2016-05-24 — End: 2016-05-24
  Administered 2016-05-24: 1000 mL via INTRAVENOUS
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
## 2016-05-24 LAB — CBC WITH DIFFERENTIAL/PLATELET
BASOS ABS: 0.1 10*3/uL (ref 0–0.1)
Basophils Relative: 1 %
EOS ABS: 0.4 10*3/uL (ref 0–0.7)
EOS PCT: 3 %
HCT: 34.7 % — ABNORMAL LOW (ref 35.0–47.0)
Hemoglobin: 11.8 g/dL — ABNORMAL LOW (ref 12.0–16.0)
LYMPHS ABS: 3.3 10*3/uL (ref 1.0–3.6)
Lymphocytes Relative: 24 %
MCH: 29.8 pg (ref 26.0–34.0)
MCHC: 34 g/dL (ref 32.0–36.0)
MCV: 87.7 fL (ref 80.0–100.0)
Monocytes Absolute: 0.9 10*3/uL (ref 0.2–0.9)
Monocytes Relative: 7 %
Neutro Abs: 8.9 10*3/uL — ABNORMAL HIGH (ref 1.4–6.5)
Neutrophils Relative %: 65 %
Platelets: 328 10*3/uL (ref 150–440)
RBC: 3.96 MIL/uL (ref 3.80–5.20)
RDW: 13.4 % (ref 11.5–14.5)
WBC: 13.7 10*3/uL — AB (ref 3.6–11.0)

## 2016-05-24 LAB — BASIC METABOLIC PANEL
Anion gap: 11 (ref 5–15)
BUN: 5 mg/dL — AB (ref 6–20)
CO2: 26 mmol/L (ref 22–32)
Calcium: 8.5 mg/dL — ABNORMAL LOW (ref 8.9–10.3)
Chloride: 106 mmol/L (ref 101–111)
Creatinine, Ser: 0.47 mg/dL (ref 0.44–1.00)
GFR calc Af Amer: >60 mL/min (ref >60)
Glucose, Bld: 91 mg/dL (ref 65–99)
POTASSIUM: 3.2 mmol/L — AB (ref 3.5–5.1)
SODIUM: 143 mmol/L (ref 135–145)

## 2016-05-24 LAB — ETHANOL: ALCOHOL ETHYL (B): 8 mg/dL — AB (ref <5)

## 2016-05-24 MED ORDER — IBUPROFEN 600 MG PO TABS
ORAL_TABLET | ORAL | Status: AC
  Administered 2016-05-24: 600 mg via ORAL
  Filled 2016-05-24: qty 1

## 2016-05-24 MED ORDER — IBUPROFEN 600 MG PO TABS
600.0000 mg | ORAL_TABLET | Freq: Once | ORAL | Status: AC
  Administered 2016-05-24: 600 mg via ORAL
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
## 2016-05-24 NOTE — Discharge Instructions (Signed)
1. You may take Tylenol and/or ibuprofen as needed for discomfort. 2. Apply ice to affected area several times daily. 3. Return to the ER for worsening symptoms, persistent vomiting, lethargy or other concerns.

## 2016-05-24 NOTE — ED Notes (Signed)
Pt provided with blue paper scrubs upon request

## 2016-05-24 NOTE — ED Notes (Signed)
Per [employer] operational officersecretary, pt called asking for pain medication, when this nurse entered room, pt asleep and snoring.

## 2016-05-24 NOTE — ED Notes (Signed)
Pt escorted out of ED by [employer]

## 2016-08-06 ENCOUNTER — Emergency Department
Admission: EM | Admit: 2016-08-06 | Discharge: 2016-08-06 | Disposition: A | Discharge status: Home / Self Care | Payer: No Typology Code available for payment source | Attending: Emergency Medicine | Admitting: Emergency Medicine

## 2016-08-06 DIAGNOSIS — F32A Depression, unspecified: Secondary | ICD-10-CM

## 2016-08-06 DIAGNOSIS — J45909 Unspecified asthma, uncomplicated: Secondary | ICD-10-CM | POA: Insufficient documentation

## 2016-08-06 DIAGNOSIS — Z79899 Other long term (current) drug therapy: Secondary | ICD-10-CM | POA: Insufficient documentation

## 2016-08-06 DIAGNOSIS — F17210 Nicotine dependence, cigarettes, uncomplicated: Secondary | ICD-10-CM | POA: Insufficient documentation

## 2016-08-06 DIAGNOSIS — K219 Gastro-esophageal reflux disease without esophagitis: Secondary | ICD-10-CM

## 2016-08-06 DIAGNOSIS — F4310 Post-traumatic stress disorder, unspecified: Secondary | ICD-10-CM

## 2016-08-06 DIAGNOSIS — Z791 Long term (current) use of non-steroidal anti-inflammatories (NSAID): Secondary | ICD-10-CM | POA: Insufficient documentation

## 2016-08-06 DIAGNOSIS — F329 Major depressive disorder, single episode, unspecified: Secondary | ICD-10-CM | POA: Insufficient documentation

## 2016-08-06 LAB — URINE DRUG SCREEN, QUALITATIVE (ARMC ONLY)
AMPHETAMINES, UR SCREEN: NOT DETECTED
BARBITURATES, UR SCREEN: NOT DETECTED
BENZODIAZEPINE, UR SCRN: NOT DETECTED
CANNABINOID 50 NG, UR ~~LOC~~: POSITIVE — AB
Cocaine Metabolite,Ur ~~LOC~~: POSITIVE — AB
MDMA (Ecstasy)Ur Screen: NOT DETECTED
Methadone Scn, Ur: NOT DETECTED
OPIATE, UR SCREEN: NOT DETECTED
PHENCYCLIDINE (PCP) UR S: NOT DETECTED
Tricyclic, Ur Screen: NOT DETECTED

## 2016-08-06 LAB — CBC
HCT: 38.4 % (ref 35.0–47.0)
Hemoglobin: 12.9 g/dL (ref 12.0–16.0)
MCH: 29.1 pg (ref 26.0–34.0)
MCHC: 33.5 g/dL (ref 32.0–36.0)
MCV: 86.9 fL (ref 80.0–100.0)
PLATELETS: 323 10*3/uL (ref 150–440)
RBC: 4.42 MIL/uL (ref 3.80–5.20)
RDW: 13.5 % (ref 11.5–14.5)
WBC: 12.5 10*3/uL — AB (ref 3.6–11.0)

## 2016-08-06 LAB — COMPREHENSIVE METABOLIC PANEL
ALK PHOS: 108 U/L (ref 38–126)
ALT: 20 U/L (ref 14–54)
AST: 36 U/L (ref 15–41)
Albumin: 4.3 g/dL (ref 3.5–5.0)
Anion gap: 11 (ref 5–15)
BUN: 7 mg/dL (ref 6–20)
CALCIUM: 9.1 mg/dL (ref 8.9–10.3)
CHLORIDE: 99 mmol/L — AB (ref 101–111)
CO2: 29 mmol/L (ref 22–32)
CREATININE: 0.66 mg/dL (ref 0.44–1.00)
Glucose, Bld: 136 mg/dL — ABNORMAL HIGH (ref 65–99)
Potassium: 2.6 mmol/L — CL (ref 3.5–5.1)
Sodium: 139 mmol/L (ref 135–145)
Total Bilirubin: 0.5 mg/dL (ref 0.3–1.2)
Total Protein: 8 g/dL (ref 6.5–8.1)

## 2016-08-06 LAB — ETHANOL: Alcohol, Ethyl (B): <5 mg/dL (ref <5)

## 2016-08-06 MED ORDER — POTASSIUM CHLORIDE CRYS ER 20 MEQ PO TBCR
40.0000 meq | EXTENDED_RELEASE_TABLET | Freq: Once | ORAL | Status: AC
  Administered 2016-08-06: 40 meq via ORAL
  Filled 2016-08-06: qty 2

## 2016-08-06 MED ORDER — TRAZODONE HCL 100 MG PO TABS: 100.0000 mg | ORAL_TABLET | Freq: Every day | ORAL | 1 refills | Status: DC

## 2016-08-06 MED ORDER — FLUOXETINE HCL 20 MG PO CAPS: 20.0000 mg | ORAL_CAPSULE | Freq: Every day | ORAL | 1 refills | Status: DC

## 2016-08-06 NOTE — ED Notes (Signed)
Pt placed on monitor due to critical potassium level.

## 2016-08-06 NOTE — ED Provider Notes (Signed)
-----------------------------------------   4:08 PM on 08/06/2016 -----------------------------------------   Blood pressure 119/74, pulse 77, temperature 97.6 F (36.4 C), temperature source Oral, resp. rate (!) 22, height  (1.626 m), weight 240 lb (108.9 kg), SpO2 94 %.  The patient had no acute events since last update.  Calm and cooperative at this time.  Patient is not clinically intoxicated at this time. Has decisional capacity and is not expressing any suicidal or homicidal ideation. She denied by Dr. Toni Amend who prescribed Prozac as well as trazodone. Patient will follow with [facility]. She understands the plan and is willing to comply.    Myrna Blazer, MD 08/06/16 [phone]
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
## 2016-08-06 NOTE — ED Notes (Signed)
Pt verbalized understanding of discharge instructions. NAD at this time. 

## 2016-08-09 ENCOUNTER — Emergency Department: Payer: Self-pay

## 2016-08-09 ENCOUNTER — Emergency Department
Admission: EM | Admit: 2016-08-09 | Discharge: 2016-08-10 | Disposition: A | Discharge status: Home / Self Care | Payer: Self-pay | Attending: Emergency Medicine | Admitting: Emergency Medicine

## 2016-08-09 DIAGNOSIS — Z5181 Encounter for therapeutic drug level monitoring: Secondary | ICD-10-CM | POA: Insufficient documentation

## 2016-08-09 DIAGNOSIS — F4310 Post-traumatic stress disorder, unspecified: Secondary | ICD-10-CM | POA: Diagnosis present

## 2016-08-09 DIAGNOSIS — T424X2A Poisoning by benzodiazepines, intentional self-harm, initial encounter: Secondary | ICD-10-CM | POA: Insufficient documentation

## 2016-08-09 DIAGNOSIS — R45851 Suicidal ideations: Secondary | ICD-10-CM

## 2016-08-09 DIAGNOSIS — F1910 Other psychoactive substance abuse, uncomplicated: Secondary | ICD-10-CM

## 2016-08-09 LAB — COMPREHENSIVE METABOLIC PANEL
ALT: 16 U/L (ref 14–54)
AST: 27 U/L (ref 15–41)
Albumin: 4.4 g/dL (ref 3.5–5.0)
Alkaline Phosphatase: 118 U/L (ref 38–126)
Anion gap: 15 (ref 5–15)
BILIRUBIN TOTAL: 0.5 mg/dL (ref 0.3–1.2)
BUN: <5 mg/dL — ABNORMAL LOW (ref 6–20)
CALCIUM: 9.2 mg/dL (ref 8.9–10.3)
CO2: 21 mmol/L — ABNORMAL LOW (ref 22–32)
Chloride: 102 mmol/L (ref 101–111)
Creatinine, Ser: 0.68 mg/dL (ref 0.44–1.00)
GFR calc Af Amer: >60 mL/min (ref >60)
GLUCOSE: 70 mg/dL (ref 65–99)
Potassium: 3.5 mmol/L (ref 3.5–5.1)
Sodium: 138 mmol/L (ref 135–145)
Total Protein: 7.7 g/dL (ref 6.5–8.1)

## 2016-08-09 LAB — CBC
HEMATOCRIT: 38.2 % (ref 35.0–47.0)
HEMOGLOBIN: 12.9 g/dL (ref 12.0–16.0)
MCH: 29.5 pg (ref 26.0–34.0)
MCHC: 33.8 g/dL (ref 32.0–36.0)
MCV: 87.1 fL (ref 80.0–100.0)
Platelets: 345 10*3/uL (ref 150–440)
RBC: 4.39 MIL/uL (ref 3.80–5.20)
RDW: 13.7 % (ref 11.5–14.5)
WBC: 15.1 10*3/uL — ABNORMAL HIGH (ref 3.6–11.0)

## 2016-08-09 LAB — ETHANOL: Alcohol, Ethyl (B): <5 mg/dL (ref <5)

## 2016-08-09 LAB — SALICYLATE LEVEL: Salicylate Lvl: <7 mg/dL (ref 2.8–30.0)

## 2016-08-09 LAB — TROPONIN I: Troponin I: <0.03 ng/mL (ref <0.03)

## 2016-08-09 LAB — ACETAMINOPHEN LEVEL: Acetaminophen (Tylenol), Serum: <10 ug/mL — ABNORMAL LOW (ref 10–30)

## 2016-08-09 MED ORDER — FLUMAZENIL 0.5 MG/5ML IV SOLN
0.2000 mg | Freq: Once | INTRAVENOUS | Status: AC
Start: 2016-08-09 — End: 2016-08-09
  Administered 2016-08-09: 0.2 mg via INTRAVENOUS
  Filled 2016-08-09: qty 5

## 2016-08-09 MED ORDER — SODIUM CHLORIDE 0.9 % IV SOLN
1000.0000 mL | Freq: Once | INTRAVENOUS | Status: AC
  Administered 2016-08-09: 1000 mL via INTRAVENOUS

## 2016-08-09 NOTE — ED Notes (Signed)
Patient continues to be alert, but seems confused. Patient walked to bathroom and stated "That's not it, I need to find the bathroom". Patient redirected to bathroom again, but still seemed confused about where/what bathroom is. When this RN neared room with TTS, patient asked if we were there to take x-rays.
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
## 2016-08-09 NOTE — ED Notes (Signed)
Pt pulled IV from left arm. Tech removed tape and covered with 2x2. IV pole and bag removed from room. IV bag was empty.

## 2016-08-09 NOTE — ED Notes (Signed)
BEHAVIORAL HEALTH ROUNDING Patient sleeping: Yes.   Patient alert and oriented: eyes closed  Appears to be asleep Behavior appropriate: Yes.  ; If no, describe:  Nutrition and fluids offered: Yes  Toileting and hygiene offered: sleeping Sitter present: q 15 minute observations and security monitoring Law enforcement present: yes  ODS 

## 2016-08-09 NOTE — ED Notes (Signed)
PT IVC/SEEN BY DR CLAPACS/PT TOO SEDATED TO COOPERATE/WILL REEVALUATE TOMORROW.

## 2016-08-09 NOTE — ED Notes (Addendum)
Pt standing by the side of the bed - IV tubing is wrapped all under the mattress  - pt untangled  - attempted to reorient  Continue to monitor

## 2016-08-09 NOTE — ED Provider Notes (Signed)
[facility] Emergency Department Provider Note       Time seen: ----------------------------------------- 2:39 PM on 08/09/2016 -----------------------------------------   L5 caveat: Review of systems history is limited by altered mental status  I have reviewed the triage vital signs and the nursing notes.   HISTORY   Chief Complaint Suicidal    HPI Tracie Hodge is a 41 y.o. female who presents to the ED for suicidal ideation. Patient is brought in by [facility] department for involuntary commitment secondary suicidal ideation. Patient reports she took 54 Klonopin and used cocaine prior to arrival. Officers report initially she was alert and speaking clearly but now her speech is slurred and she is disoriented.   History reviewed. No pertinent past medical history.  There are no active problems to display for this patient.   History reviewed. No pertinent surgical history.  Allergies Patient has no known allergies.  Social History Social History  Substance Use Topics  . Smoking status: Never Smoker  . Smokeless tobacco: Never Used  . Alcohol use Yes     Comment: unable to answer due to status of pt    Review of Systems Unknown at this time ____________________________________________   PHYSICAL EXAM:  VITAL SIGNS: ED Triage Vitals [08/09/16 1414]  Enc Vitals Group     BP      Pulse      Resp      Temp      Temp src      SpO2      Weight 180 lb (81.6 kg)     Height  (1.575 m)     Head Circumference      Peak Flow      Pain Score 0     Pain Loc      Pain Edu?      Excl. in GC?     Constitutional: Alert but disoriented,  no distress Eyes: Conjunctivae are normal. PERRL. Normal extraocular movements. ENT   Head: Normocephalic and atraumatic.   Nose: No congestion/rhinnorhea.   Mouth/Throat: Mucous membranes are moist.   Neck: No stridor. Cardiovascular: Normal rate, regular rhythm. No murmurs, rubs, or  gallops. Respiratory: Normal respiratory effort without tachypnea nor retractions. Breath sounds are clear and equal bilaterally. No wheezes/rales/rhonchi. Gastrointestinal: Soft and nontender. Normal bowel sounds Musculoskeletal: Nontender with normal range of motion in extremities. No lower extremity tenderness nor edema. Neurologic:  Slurred speech, disoriented No gross focal neurologic deficits are appreciated.  Skin:  Skin is warm, dry and intact. No rash noted. Psychiatric: Slurred speech ____________________________________________  EKG: Interpreted by me. Sinus rhythm rate 81 bpm, normal PR interval, normal QRS, normal Q-T. Normal axis.  ____________________________________________  ED COURSE:  Pertinent labs & imaging results that were available during my care of the patient were reviewed by me and considered in my medical decision making (see chart for details). Patient presents for an overdose, we will assess with labs and imaging as indicated. Patient will receive a dose of IV flumazenil.   Procedures ____________________________________________   LABS (pertinent positives/negatives)  Labs Reviewed  COMPREHENSIVE METABOLIC PANEL - Abnormal; Notable for the following:       Result Value   CO2 21 (*)    BUN <5 (*)    All other components within normal limits  CBC - Abnormal; Notable for the following:    WBC 15.1 (*)    All other components within normal limits  ETHANOL  URINE DRUG SCREEN, QUALITATIVE ([facility] ONLY)  TROPONIN I  URINALYSIS, COMPLETE (UACMP) WITH MICROSCOPIC  ACETAMINOPHEN LEVEL  SALICYLATE LEVEL    RADIOLOGY  CT head and chest x-ray are unremarkable  ____________________________________________  FINAL ASSESSMENT AND PLAN  Overdose, suicidal ideation, polysubstance abuse  Plan: Patient's labs and imaging were dictated above. Patient had presented for overdose and possible suicidal ideation. She is brought in under involuntary commitment and we  will continue this. I will consult psychiatry for polysubstance abuse and possible overdose with suicidal ideation.   Nechama Filbert, MD   Note: This note was generated in part or whole with voice recognition software. Voice recognition is usually quite accurate but there are transcription errors that can and very often do occur. I apologize for any typographical errors that were not detected and corrected.     Tessla Filbert, MD 08/09/16 [phone]

## 2016-08-09 NOTE — Progress Notes (Signed)
TTS attempted to conduct assessment. Patient was confused and unable to be engaged in the assessment.  Patient was mumbling incoherently.

## 2016-08-09 NOTE — ED Notes (Signed)
MD Clapacs attempted to consult but pt would not awaken enough for assessment

## 2016-08-09 NOTE — ED Notes (Signed)
Pt given supper tray. Pt and pt linens changed due to being wet.

## 2016-08-09 NOTE — Consult Note (Signed)
Psychiatry: Consult received. Chart reviewed. Attempted to interview patient but she is too sedated to cooperate right now. I will reevaluate once she is more alert and interactive.

## 2016-08-09 NOTE — ED Notes (Signed)
PT IVC PENDING CONSULT  

## 2016-08-09 NOTE — ED Notes (Signed)
Pt had a 20g to her left arm that she removed herself - catheter intact

## 2016-08-09 NOTE — ED Notes (Signed)
I introduced myself to her  - pt confused - climbing into the bed sideways on her knees    Attempted to reorient  - plan of care discussed    BEHAVIORAL HEALTH ROUNDING Patient sleeping: No. Patient alert and oriented: yes Behavior appropriate: Yes.  ; If no, describe:  Nutrition and fluids offered: yes Toileting and hygiene offered: Yes  Sitter present: q15 minute observations and security monitoring Law enforcement present: Yes  ODS

## 2016-08-09 NOTE — ED Triage Notes (Signed)
Pt in custody of sherriff dept for IVC due to suicidal ideation - pt reports that she took 27 Klonopin and smoked crack and used cocaine - officer reports that pt was alert and speaking clearly when she was arrested - at this time speech is slurred and incoherent and pt is not making any sense with the sentences she is making

## 2016-08-10 DIAGNOSIS — T4271XD Poisoning by unspecified antiepileptic and sedative-hypnotic drugs, accidental (unintentional), subsequent encounter: Secondary | ICD-10-CM

## 2016-08-10 LAB — URINALYSIS, COMPLETE (UACMP) WITH MICROSCOPIC
Bacteria, UA: NONE SEEN
Bilirubin Urine: NEGATIVE
GLUCOSE, UA: NEGATIVE mg/dL
Hgb urine dipstick: NEGATIVE
KETONES UR: 5 mg/dL — AB
Leukocytes, UA: NEGATIVE
NITRITE: NEGATIVE
PH: 5 (ref 5.0–8.0)
Protein, ur: NEGATIVE mg/dL
Specific Gravity, Urine: 1.012 (ref 1.005–1.030)

## 2016-08-10 LAB — URINE DRUG SCREEN, QUALITATIVE (ARMC ONLY)
Amphetamines, Ur Screen: NOT DETECTED
Barbiturates, Ur Screen: NOT DETECTED
Benzodiazepine, Ur Scrn: NOT DETECTED
CANNABINOID 50 NG, UR ~~LOC~~: NOT DETECTED
Cocaine Metabolite,Ur ~~LOC~~: POSITIVE — AB
MDMA (Ecstasy)Ur Screen: NOT DETECTED
Methadone Scn, Ur: NOT DETECTED
Opiate, Ur Screen: NOT DETECTED
PHENCYCLIDINE (PCP) UR S: NOT DETECTED
TRICYCLIC, UR SCREEN: POSITIVE — AB

## 2016-08-10 MED ORDER — ALBUTEROL SULFATE (2.5 MG/3ML) 0.083% IN NEBU: 2.5000 mg | INHALATION_SOLUTION | Freq: Four times a day (QID) | RESPIRATORY_TRACT | Status: DC | PRN

## 2016-08-10 MED ORDER — ALBUTEROL SULFATE HFA 108 (90 BASE) MCG/ACT IN AERS: 2.0000 | INHALATION_SPRAY | Freq: Four times a day (QID) | RESPIRATORY_TRACT | Status: DC | PRN

## 2016-08-10 MED ORDER — ESCITALOPRAM OXALATE 10 MG PO TABS
10.0000 mg | ORAL_TABLET | Freq: Every day | ORAL | Status: DC
  Administered 2016-08-10: 10 mg via ORAL
  Filled 2016-08-10: qty 1

## 2016-08-10 MED ORDER — FAMOTIDINE 20 MG PO TABS
20.0000 mg | ORAL_TABLET | Freq: Every day | ORAL | Status: DC
  Administered 2016-08-10: 20 mg via ORAL
  Filled 2016-08-10: qty 1

## 2016-08-10 MED ORDER — TRAZODONE HCL 100 MG PO TABS: 100.0000 mg | ORAL_TABLET | Freq: Every day | ORAL | Status: DC

## 2016-08-10 MED ORDER — FLUOXETINE HCL 20 MG PO CAPS
20.0000 mg | ORAL_CAPSULE | Freq: Every day | ORAL | Status: DC
  Administered 2016-08-10: 20 mg via ORAL
  Filled 2016-08-10: qty 1

## 2016-08-10 NOTE — ED Notes (Signed)
Received in report that she has had an episode of incontinence - bladder and bowel  EDT assisting pt to shower

## 2016-08-10 NOTE — ED Notes (Signed)
Dr.clapaac in room with patient 

## 2016-08-10 NOTE — ED Notes (Signed)
ED BHU PLACEMENT JUSTIFICATION Is the patient under IVC or is there intent for IVC: Yes.   Is the patient medically cleared: Yes.   Is there vacancy in the ED BHU: Yes.   Is the population mix appropriate for patient: Yes.   Is the patient awaiting placement in inpatient or outpatient setting: Yes.   Has the patient had a psychiatric consult: Yes.   Survey of unit performed for contraband, proper placement and condition of furniture, tampering with fixtures in bathroom, shower, and each patient room: Yes.  ; Findings:  APPEARANCE/BEHAVIOR Calm and cooperative NEURO ASSESSMENT Orientation: oriented x4  Denies pain Hallucinations: No.None noted (Hallucinations) Speech: Normal Gait: normal RESPIRATORY ASSESSMENT Even  Unlabored respirations  CARDIOVASCULAR ASSESSMENT Pulses equal   regular rate  Skin warm and dry   GASTROINTESTINAL ASSESSMENT no GI complaint EXTREMITIES Full ROM  PLAN OF CARE Provide calm/safe environment. Vital signs assessed twice daily. ED BHU Assessment once each 12-hour shift. Collaborate with TTS daily or as condition indicates. Assure the ED provider has rounded once each shift. Provide and encourage hygiene. Provide redirection as needed. Assess for escalating behavior; address immediately and inform ED provider.  Assess family dynamic and appropriateness for visitation as needed: Yes.  ; If necessary, describe findings:  Educate the patient/family about BHU procedures/visitation: Yes.  ; If necessary, describe findings:   

## 2016-08-10 NOTE — ED Notes (Signed)
Am meds administered as ordered  Assessment completed   

## 2016-08-10 NOTE — ED Notes (Signed)
Patient awake now and ambulatory without difficulty to restroom. Patient stated that she "peed in my bed". No urine noted, but linens changed regardless. Patient returned to her bed without incident. No other needs at this time. New warm blanket also given.

## 2016-08-10 NOTE — Consult Note (Signed)
[facility] [facility] Psychiatry Consult   Reason for Consult:  Consult for 41 year old woman who came to the hospital after overdosing on Benadryl yesterday around noon. Referring Physician:  Jimmye Norman Patient Identification: Tracie Hodge MRN:  [id number] Principal Diagnosis: Overdose of sedative or hypnotic Diagnosis:   Patient Active Problem List   Diagnosis Date Noted  . 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 PTSD (post-traumatic stress disorder) [F43.10] 08/06/2016  . GERD (gastroesophageal reflux disease) [K21.9] 08/06/2016  . Cocaine abuse [F14.10] 08/06/2016    Total Time spent with patient: 1 hour  Subjective:   Tracie Hodge is a 41 y.o. female patient admitted with "my sister-in-law thinks I'm going to hurt myself".  HPI:  Patient interviewed chart reviewed. I just seen this patient in the emergency room a few days ago as well. Patient came in yesterday after overdosing on Tylenol PM with Benadryl without the acetaminophen. She says that she took several pills around noon because she wanted to sleep. She was angry because her husband's family came and repossessed the car that the patient has been using. As noted previously the patient's husband is in prison and the patient is at odds with the extended family. She was very angry at them because they came and repossessed the car from her. Patient says that prior to that she had been feeling okay for a couple days and today denies being depressed. Denies any suicidal thoughts. Denies any psychotic symptoms. Says she is feeling better since she is back on the Prozac and trazodone that I gave her a prescription for a few days ago. Admits that she continues to use cocaine occasional alcohol as well.  Social history: Living by herself. States now that she plans to get out of town and moved to a different part of the state. Husband is in prison. Has a lot of conflict with his extended family.  Medical history:  Overweight. Gastric reflux. Recovering now from overdose of Benadryl.  Substance abuse history: Patient is evasive about the clearly abuses cocaine and alcohol at times as well. Ongoing substance abuse with poor insight.  Past Psychiatric History: Patient has had a history of depression and anxiety in the past. Has had suicidal threats misuse of medication. Was not admitted to the hospital the other day. No history of psychosis that I've identified.  Risk to Self: Is patient at risk for suicide?: Yes Risk to Others:   Prior Inpatient Therapy:   Prior Outpatient Therapy:    Past Medical History:  Past Medical History:  Diagnosis Date  . Anxiety   . Asthma   . Depression   . GERD (gastroesophageal reflux disease)   . Mental disorder   . Sleep apnea    sleep study done ? 3 years ago, does not use c-pap    Past Surgical History:  Procedure Laterality Date  . ABDOMINAL HYSTERECTOMY    . CESAREAN SECTION    . CHOLECYSTECTOMY N/A 02/07/2013   Procedure: LAPAROSCOPIC CHOLECYSTECTOMY;  Surgeon: Ralene Ok, MD;  Location: [place];  Service: General;  Laterality: N/A;   Family History: No family history on file. Family Psychiatric  History: She reports several people in her family have anxiety absence abuse Social History:  History  Alcohol Use  . Yes    Comment: unable to answer due to status of pt     History  Drug Use  . Types: Cocaine    Social History   Social History  . Marital status: Unknown  Spouse name: N/A  . Number of children: N/A  . Years of education: N/A   Social History Main Topics  . Smoking status: Never Smoker  . Smokeless tobacco: Never Used  . Alcohol use Yes     Comment: unable to answer due to status of pt  . Drug use: Yes    Types: Cocaine  . Sexual activity: Not Asked   Other Topics Concern  . None   Social History Narrative   ** Merged History Encounter **       Additional Social History:    Allergies:  No Known  Allergies  Labs:  Results for orders placed or performed during the hospital encounter of 08/09/16 (from the past 48 hour(s))  Comprehensive metabolic panel     Status: Abnormal   Collection Time: 08/09/16  2:22 PM  Result Value Ref Range   Sodium 138 135 - 145 mmol/L   Potassium 3.5 3.5 - 5.1 mmol/L   Chloride 102 101 - 111 mmol/L   CO2 21 (L) 22 - 32 mmol/L   Glucose, Bld 70 65 - 99 mg/dL   BUN <5 (L) 6 - 20 mg/dL   Creatinine, Ser 0.68 0.44 - 1.00 mg/dL   Calcium 9.2 8.9 - 10.3 mg/dL   Total Protein 7.7 6.5 - 8.1 g/dL   Albumin 4.4 3.5 - 5.0 g/dL   AST 27 15 - 41 U/L   ALT 16 14 - 54 U/L   Alkaline Phosphatase 118 38 - 126 U/L   Total Bilirubin 0.5 0.3 - 1.2 mg/dL   GFR calc non Af Amer >60 >60 mL/min   GFR calc Af Amer >60 >60 mL/min    Comment: (NOTE) The eGFR has been calculated using the CKD EPI equation. This calculation has not been validated in all clinical situations. eGFR's persistently <60 mL/min signify possible Chronic Kidney Disease.    Anion gap 15 5 - 15  Ethanol     Status: None   Collection Time: 08/09/16  2:22 PM  Result Value Ref Range   Alcohol, Ethyl (B) <5 <5 mg/dL    Comment:        LOWEST DETECTABLE LIMIT FOR SERUM ALCOHOL IS 5 mg/dL FOR MEDICAL PURPOSES ONLY   cbc     Status: Abnormal   Collection Time: 08/09/16  2:22 PM  Result Value Ref Range   WBC 15.1 (H) 3.6 - 11.0 K/uL   RBC 4.39 3.80 - 5.20 MIL/uL   Hemoglobin 12.9 12.0 - 16.0 g/dL   HCT 38.2 35.0 - 47.0 %   MCV 87.1 80.0 - 100.0 fL   MCH 29.5 26.0 - 34.0 pg   MCHC 33.8 32.0 - 36.0 g/dL   RDW 13.7 11.5 - 14.5 %   Platelets 345 150 - 440 K/uL  Troponin I     Status: None   Collection Time: 08/09/16  3:25 PM  Result Value Ref Range   Troponin I <0.03 <0.03 ng/mL  Acetaminophen level     Status: Abnormal   Collection Time: 08/09/16  3:25 PM  Result Value Ref Range   Acetaminophen (Tylenol), Serum <10 (L) 10 - 30 ug/mL    Comment:        THERAPEUTIC CONCENTRATIONS  VARY SIGNIFICANTLY. A RANGE OF 10-30 ug/mL MAY BE AN EFFECTIVE CONCENTRATION FOR MANY PATIENTS. HOWEVER, SOME ARE BEST TREATED AT CONCENTRATIONS OUTSIDE THIS RANGE. ACETAMINOPHEN CONCENTRATIONS >150 ug/mL AT 4 HOURS AFTER INGESTION AND >50 ug/mL AT 12 HOURS AFTER INGESTION ARE OFTEN ASSOCIATED WITH TOXIC  REACTIONS.   Salicylate level     Status: None   Collection Time: 08/09/16  3:25 PM  Result Value Ref Range   Salicylate Lvl <6.7 2.8 - 30.0 mg/dL  Urine Drug Screen, Qualitative     Status: Abnormal   Collection Time: 08/10/16  4:50 AM  Result Value Ref Range   Tricyclic, Ur Screen POSITIVE (A) NONE DETECTED   Amphetamines, Ur Screen NONE DETECTED NONE DETECTED   MDMA (Ecstasy)Ur Screen NONE DETECTED NONE DETECTED   Cocaine Metabolite,Ur [place] POSITIVE (A) NONE DETECTED   Opiate, Ur Screen NONE DETECTED NONE DETECTED   Phencyclidine (PCP) Ur S NONE DETECTED NONE DETECTED   Cannabinoid 50 Ng, Ur [place] NONE DETECTED NONE DETECTED   Barbiturates, Ur Screen NONE DETECTED NONE DETECTED   Benzodiazepine, Ur Scrn NONE DETECTED NONE DETECTED   Methadone Scn, Ur NONE DETECTED NONE DETECTED    Comment: (NOTE) 619  Tricyclics, urine               Cutoff 1000 ng/mL 200  Amphetamines, urine             Cutoff 1000 ng/mL 300  MDMA (Ecstasy), urine           Cutoff 500 ng/mL 400  Cocaine Metabolite, urine       Cutoff 300 ng/mL 500  Opiate, urine                   Cutoff 300 ng/mL 600  Phencyclidine (PCP), urine      Cutoff 25 ng/mL 700  Cannabinoid, urine              Cutoff 50 ng/mL 800  Barbiturates, urine             Cutoff 200 ng/mL 900  Benzodiazepine, urine           Cutoff 200 ng/mL 1000 Methadone, urine                Cutoff 300 ng/mL 1100 1200 The urine drug screen provides only a preliminary, unconfirmed 1300 analytical test result and should not be used for non-medical 1400 purposes. Clinical consideration and professional judgment should 1500 be applied to any positive drug  screen result due to possible 1600 interfering substances. A more specific alternate chemical method 1700 must be used in order to obtain a confirmed analytical result.  1800 Gas chromato graphy / mass spectrometry (GC/MS) is the preferred 1900 confirmatory method.   Urinalysis, Complete w Microscopic     Status: Abnormal   Collection Time: 08/10/16  4:50 AM  Result Value Ref Range   Color, Urine STRAW (A) YELLOW   APPearance CLEAR (A) CLEAR   Specific Gravity, Urine 1.012 1.005 - 1.030   pH 5.0 5.0 - 8.0   Glucose, UA NEGATIVE NEGATIVE mg/dL   Hgb urine dipstick NEGATIVE NEGATIVE   Bilirubin Urine NEGATIVE NEGATIVE   Ketones, ur 5 (A) NEGATIVE mg/dL   Protein, ur NEGATIVE NEGATIVE mg/dL   Nitrite NEGATIVE NEGATIVE   Leukocytes, UA NEGATIVE NEGATIVE   RBC / HPF 0-5 0 - 5 RBC/hpf   WBC, UA 0-5 0 - 5 WBC/hpf   Bacteria, UA NONE SEEN NONE SEEN   Squamous Epithelial / LPF 0-5 (A) NONE SEEN   Mucous PRESENT    Hyaline Casts, UA PRESENT     No current facility-administered medications for this encounter.    Current Outpatient Prescriptions  Medication Sig Dispense Refill  . albuterol (PROVENTIL HFA;VENTOLIN HFA) 108 (90 Base)  MCG/ACT inhaler Inhale 2 puffs into the lungs every 6 (six) hours as needed for wheezing or shortness of breath.    . escitalopram (LEXAPRO) 10 MG tablet Take 10 mg by mouth daily.    Marland Kitchen FLUoxetine (PROZAC) 20 MG capsule Take 20 mg by mouth daily.    . ranitidine (ZANTAC) 150 MG tablet Take 150 mg by mouth at bedtime.    . traZODone (DESYREL) 100 MG tablet Take 100 mg by mouth at bedtime.    Marland Kitchen albuterol (PROVENTIL HFA;VENTOLIN HFA) 108 (90 Base) MCG/ACT inhaler Inhale 2 puffs into the lungs every 6 (six) hours as needed for wheezing or shortness of breath. 1 Inhaler 2  . buPROPion (WELLBUTRIN XL) 150 MG 24 hr tablet Take 150 mg by mouth daily. Take with 300 mg tablet for a 450 mg dose    . buPROPion (WELLBUTRIN XL) 300 MG 24 hr tablet Take 300 mg by mouth daily.  Take with 150 mg tablet for a 450 mg dose    . cyclobenzaprine (FLEXERIL) 10 MG tablet Take 1 tablet (10 mg total) by mouth 3 (three) times daily as needed for muscle spasms. 30 tablet 0  . escitalopram (LEXAPRO) 10 MG tablet Take 1 tablet (10 mg total) by mouth daily. 30 tablet 0  . FLUoxetine (PROZAC) 20 MG capsule Take 1 capsule (20 mg total) by mouth daily. 30 capsule 1  . HYDROcodone-acetaminophen (NORCO/VICODIN) 5-325 MG tablet Take 1 tablet by mouth every 6 (six) hours as needed for moderate pain. 12 tablet 0  . ibuprofen (ADVIL,MOTRIN) 600 MG tablet Take 1 tablet (600 mg total) by mouth every 8 (eight) hours as needed. 30 tablet 0  . naproxen (NAPROSYN) 500 MG tablet Take 1 tablet (500 mg total) by mouth 2 (two) times daily with a meal. 20 tablet 00  . ondansetron (ZOFRAN) 8 MG tablet Take 1 tablet (8 mg total) by mouth every 8 (eight) hours as needed for nausea. 20 tablet 0  . oxyCODONE-acetaminophen (PERCOCET) 7.5-325 MG tablet Take 1 tablet by mouth every 4 (four) hours as needed for severe pain. 20 tablet 0  . predniSONE (STERAPRED UNI-PAK 21 TAB) 10 MG (21) TBPK tablet Dispense steroid taper pack as directed 21 tablet 0  . promethazine (PHENERGAN) 25 MG tablet Take 25 mg by mouth every 6 (six) hours as needed for nausea.    . ranitidine (ZANTAC) 150 MG tablet Take 150 mg by mouth at bedtime.    . traZODone (DESYREL) 100 MG tablet Take 1 tablet (100 mg total) by mouth at bedtime. 30 tablet 1  . trazodone (DESYREL) 300 MG tablet Take 300 mg by mouth at bedtime.      Musculoskeletal: Strength & Muscle Tone: within normal limits Gait & Station: normal Patient leans: N/A  Psychiatric Specialty Exam: Physical Exam  Nursing note and vitals reviewed. Constitutional: She appears well-developed and well-nourished.  HENT:  Head: Normocephalic and atraumatic.  Eyes: Conjunctivae are normal. Pupils are equal, round, and reactive to light.  Neck: Normal range of motion.  Cardiovascular:  Regular rhythm and normal heart sounds.   Respiratory: Effort normal. No respiratory distress.  GI: Soft.  Musculoskeletal: Normal range of motion.  Neurological: She is alert.  Skin: Skin is warm and dry.  Psychiatric: Her affect is blunt. Her speech is delayed. She is slowed. Thought content is paranoid. She expresses impulsivity. She expresses no homicidal and no suicidal ideation. She exhibits abnormal recent memory.    Review of Systems  Constitutional: Negative.  HENT: Negative.   Eyes: Negative.   Respiratory: Negative.   Cardiovascular: Negative.   Gastrointestinal: Negative.   Musculoskeletal: Negative.   Skin: Negative.   Neurological: Negative.   Psychiatric/Behavioral: Positive for memory loss and substance abuse. Negative for depression, hallucinations and suicidal ideas. The patient has insomnia. The patient is not nervous/anxious.     Blood pressure 122/72, pulse 73, temperature 98 F (36.7 C), temperature source Oral, resp. rate 17, height '5\' 2"'$  (1.575 m), weight 81.6 kg (180 lb), SpO2 98 %.Body mass index is 32.92 kg/m.  General Appearance: Disheveled  Eye Contact:  Fair  Speech:  Slow  Volume:  Decreased  Mood:  Euthymic  Affect:  Constricted  Thought Process:  Goal Directed  Orientation:  Full (Time, Place, and Person)  Thought Content:  Logical  Suicidal Thoughts:  No  Homicidal Thoughts:  No  Memory:  Immediate;   Fair Recent;   Fair Remote;   Fair  Judgement:  Fair  Insight:  Fair  Psychomotor Activity:  Decreased  Concentration:  Concentration: Fair  Recall:  [employer] of Knowledge:  Fair  Language:  Fair  Akathisia:  No  Handed:  Right  AIMS (if indicated):     Assets:  Physical Health Resilience  ADL's:  Intact  Cognition:  Impaired,  Mild  Sleep:        Treatment Plan Summary: Plan Patient has recovered from overdose of Benadryl yesterday. Insist that there was nothing suicidal about it. Patient is able to articulate positive plans  for the future. Clearly has an ongoing substance abuse problem and mood instability but does not appear to be psychotic. Does not meet commitment criteria. Not likely to benefit from hospital level treatment. Encouraged patient to make sure she stays off of drugs and to follow-up with outpatient treatment. Case reviewed with the ER physician and TTS. She can be discharged and will follow-up with local mental health.  Disposition: Patient does not meet criteria for psychiatric inpatient admission. Supportive therapy provided about ongoing stressors.  Alethia Berthold, MD 08/10/2016 6:15 PM

## 2016-08-10 NOTE — ED Notes (Signed)
BEHAVIORAL HEALTH ROUNDING Patient sleeping: Yes.   Patient alert and oriented: eyes closed  Appears to be asleep  Snoring can be heard   Behavior appropriate: Yes.  ; If no, describe:  Nutrition and fluids offered: Yes  Toileting and hygiene offered: sleeping Sitter present: q 15 minute observations and security monitoring Law enforcement present: yes  ODS  ENVIRONMENTAL ASSESSMENT Potentially harmful objects out of patient reach: Yes.   Personal belongings secured: Yes.   Patient dressed in hospital provided attire only: Yes.   Plastic bags out of patient reach: Yes.   Patient care equipment (cords, cables, call bells, lines, and drains) shortened, removed, or accounted for: Yes.   Equipment and supplies removed from bottom of stretcher: Yes.   Potentially toxic materials out of patient reach: Yes.   Sharps container removed or out of patient reach: Yes.

## 2016-08-10 NOTE — ED Notes (Signed)
Rescinded IVC by Dr. Clapacs 

## 2016-08-10 NOTE — ED Notes (Signed)
BEHAVIORAL HEALTH ROUNDING Patient sleeping: Yes.   Patient alert and oriented: eyes closed  Appears to be asleep Behavior appropriate: Yes.  ; If no, describe:  Nutrition and fluids offered: Yes  Toileting and hygiene offered: sleeping Sitter present: q 15 minute observations and security monitoring Law enforcement present: yes  ODS 

## 2016-08-10 NOTE — ED Notes (Signed)
Poison control called back. Poison control notified of QRS and QTc results on repeat EKG. No further needs other than continued monitoring d/t somnolence. Patient continues to sleep soundly at this time.

## 2016-08-10 NOTE — ED Notes (Signed)
Patient continues to sleep soundly. Rise of chest noted and regular.

## 2016-08-10 NOTE — ED Notes (Addendum)
Received a call from her sister in law - yelling on the phone  Durwin Glaze  [phone])  "I had her committed yesterday and the officer said that y'all would keep her and make her stop using drugs and get her clean  - She sent texts saying that she is going to kill herself.  I am so mad - you are going to be held responsible when something happens to her..."  Family member reassured that the pt received a full assessment by the specialist and that she must have contracted for safety and the provider deemed her safe for discharge - she then hung up

## 2016-08-12 ENCOUNTER — Encounter: Payer: Self-pay | Admitting: Emergency Medicine

## 2016-08-12 ENCOUNTER — Emergency Department
Admission: EM | Admit: 2016-08-12 | Discharge: 2016-08-13 | Disposition: A | Discharge status: Other Acute Inpatient Hospital | Payer: No Typology Code available for payment source | Attending: Emergency Medicine | Admitting: Emergency Medicine

## 2016-08-12 DIAGNOSIS — F332 Major depressive disorder, recurrent severe without psychotic features: Secondary | ICD-10-CM | POA: Insufficient documentation

## 2016-08-12 DIAGNOSIS — F4310 Post-traumatic stress disorder, unspecified: Secondary | ICD-10-CM | POA: Diagnosis present

## 2016-08-12 DIAGNOSIS — J45909 Unspecified asthma, uncomplicated: Secondary | ICD-10-CM | POA: Insufficient documentation

## 2016-08-12 DIAGNOSIS — K219 Gastro-esophageal reflux disease without esophagitis: Secondary | ICD-10-CM | POA: Diagnosis present

## 2016-08-12 DIAGNOSIS — E876 Hypokalemia: Secondary | ICD-10-CM | POA: Insufficient documentation

## 2016-08-12 DIAGNOSIS — T1491XA Suicide attempt, initial encounter: Secondary | ICD-10-CM

## 2016-08-12 DIAGNOSIS — R45851 Suicidal ideations: Secondary | ICD-10-CM

## 2016-08-12 DIAGNOSIS — T391X2A Poisoning by 4-Aminophenol derivatives, intentional self-harm, initial encounter: Secondary | ICD-10-CM | POA: Insufficient documentation

## 2016-08-12 LAB — ACETAMINOPHEN LEVEL: Acetaminophen (Tylenol), Serum: <10 ug/mL — ABNORMAL LOW (ref 10–30)

## 2016-08-12 LAB — COMPREHENSIVE METABOLIC PANEL
ALT: 14 U/L (ref 14–54)
AST: 17 U/L (ref 15–41)
Albumin: 4.1 g/dL (ref 3.5–5.0)
Alkaline Phosphatase: 103 U/L (ref 38–126)
Anion gap: 9 (ref 5–15)
BILIRUBIN TOTAL: 0.3 mg/dL (ref 0.3–1.2)
BUN: <5 mg/dL — ABNORMAL LOW (ref 6–20)
CO2: 28 mmol/L (ref 22–32)
CREATININE: 0.68 mg/dL (ref 0.44–1.00)
Calcium: 9.1 mg/dL (ref 8.9–10.3)
Chloride: 104 mmol/L (ref 101–111)
Glucose, Bld: 104 mg/dL — ABNORMAL HIGH (ref 65–99)
Potassium: 3.1 mmol/L — ABNORMAL LOW (ref 3.5–5.1)
Sodium: 141 mmol/L (ref 135–145)
TOTAL PROTEIN: 7.7 g/dL (ref 6.5–8.1)

## 2016-08-12 LAB — CBC
HCT: 38.9 % (ref 35.0–47.0)
Hemoglobin: 13.1 g/dL (ref 12.0–16.0)
MCH: 29.4 pg (ref 26.0–34.0)
MCHC: 33.7 g/dL (ref 32.0–36.0)
MCV: 87.4 fL (ref 80.0–100.0)
PLATELETS: 331 10*3/uL (ref 150–440)
RBC: 4.46 MIL/uL (ref 3.80–5.20)
RDW: 13.7 % (ref 11.5–14.5)
WBC: 12.1 10*3/uL — ABNORMAL HIGH (ref 3.6–11.0)

## 2016-08-12 LAB — URINE DRUG SCREEN, QUALITATIVE (ARMC ONLY)
Amphetamines, Ur Screen: NOT DETECTED
BARBITURATES, UR SCREEN: NOT DETECTED
BENZODIAZEPINE, UR SCRN: POSITIVE — AB
CANNABINOID 50 NG, UR ~~LOC~~: POSITIVE — AB
Cocaine Metabolite,Ur ~~LOC~~: POSITIVE — AB
MDMA (ECSTASY) UR SCREEN: NOT DETECTED
Methadone Scn, Ur: NOT DETECTED
Opiate, Ur Screen: NOT DETECTED
Phencyclidine (PCP) Ur S: NOT DETECTED
TRICYCLIC, UR SCREEN: NOT DETECTED

## 2016-08-12 LAB — ETHANOL

## 2016-08-12 LAB — SALICYLATE LEVEL

## 2016-08-12 MED ORDER — TRAZODONE HCL 100 MG PO TABS
100.0000 mg | ORAL_TABLET | Freq: Every day | ORAL | Status: DC
  Administered 2016-08-12: 100 mg via ORAL
  Filled 2016-08-12: qty 1

## 2016-08-12 MED ORDER — POTASSIUM CHLORIDE CRYS ER 20 MEQ PO TBCR
40.0000 meq | EXTENDED_RELEASE_TABLET | Freq: Once | ORAL | Status: AC
  Administered 2016-08-12: 40 meq via ORAL
  Filled 2016-08-12: qty 2

## 2016-08-12 MED ORDER — FLUOXETINE HCL 20 MG PO CAPS: 20.0000 mg | ORAL_CAPSULE | Freq: Every day | ORAL | Status: DC

## 2016-08-12 NOTE — ED Notes (Signed)
Pt dressed out into appropriate behavioral health clothing with this tech in the room. Pt belongings consist of a pair of white tennis shoes, a pair of tan shoes with pink polka dots, a yellow shirt, a leopard printed bra, blue jeans, a black belt, a black purse, a black, pink and green bag with elephants on it, a red sweat shirt, a white ring, a white necklace with a pink stone and a pair of pink panties. Pt calm and cooperative while dressing out. Pt has a bottle of Advil liqui-gels, a bottle of Prozac  and a bottle of Trazodone . Walked by to [facility]. Bags of meds given to Amy T.,RN.

## 2016-08-12 NOTE — ED Notes (Signed)
Hourly rounding reveals patient sleeping in room. No complaints, stable, in no acute distress. Q15 minute rounds and monitoring via Security Cameras to continue. 

## 2016-08-12 NOTE — ED Notes (Addendum)
Bag of meds placed on countertop - I asked for staff member to secure them in the pt belonging bag due to influx of pts at this time and the safety of everyone  meds were not given to me - they were left on counter

## 2016-08-12 NOTE — BH Assessment (Signed)
Assessment Note  Tracie Hodge is an 41 y.o. female. Who presents to the emergency department voluntarily with complaints of depression. Patient states that she was renting a room but has recently been asked to to move out. Patient reports that she's currently working but experiencing emotional and financial turmoil. Pt reports that following discharge from the ER she took a entire bottle of tylenol as well some over the counter medications. Pt reports that she is taking medications prescribe by Dr. Toni Amend although today she has [place]. A behavioral health assessment has been completed including evaluation of the patient, collecting collateral history:, reviewing available medical/clinic records, evaluating his unique risk and protective factors, and discussing treatment recommendations.    Diagnosis: Major Depressive Disorder  Past Medical History:  Past Medical History:  Diagnosis Date  . Anxiety   . Asthma   . Depression   . GERD (gastroesophageal reflux disease)   . Mental disorder   . Sleep apnea    sleep study done ? 3 years ago, does not use c-pap    Past Surgical History:  Procedure Laterality Date  . ABDOMINAL HYSTERECTOMY    . CESAREAN SECTION    . CHOLECYSTECTOMY N/A 02/07/2013   Procedure: LAPAROSCOPIC CHOLECYSTECTOMY;  Surgeon: Axel Filler, MD;  Location: [facility] OR;  Service: General;  Laterality: N/A;    Family History: No family history on file.  Social History:  reports that she has never smoked. She has never used smokeless tobacco. She reports that she drinks alcohol. She reports that she uses drugs, including Cocaine.  Additional Social History:  Alcohol / Drug Use Pain Medications: SEE MAR Prescriptions: SEE MAR Over the Counter: SEE MAR History of alcohol / drug use?: Yes Longest period of sobriety (when/how long): Unknown   CIWA: CIWA-Ar BP: 128/75 Pulse Rate: 88 COWS:    Allergies: No Known  Allergies  Home Medications:  (Not in a hospital admission)  OB/GYN Status:  No LMP recorded (lmp unknown). Patient is not currently having periods (Reason: Other).  General Assessment Data Location of Assessment: [facility] ED TTS Assessment: In system Is this a Tele or Face-to-Face Assessment?: Face-to-Face Is this an Initial Assessment or a Re-assessment for this encounter?: Initial Assessment Marital status: Separated Is patient pregnant?: No Pregnancy Status: No Admission Status: Voluntary Is patient capable of signing voluntary admission?: No Referral Source: Self/Family/Friend Insurance type: PHCS  Medical Screening Exam [facility] Walk-in ONLY) Medical Exam completed: Yes  Crisis Care Plan Legal Guardian: Other: (none) Name of Psychiatrist: [facility] Name of Therapist: [facility]  Education Status Is patient currently in school?: No Current Grade: n/a Highest grade of school patient has completed: some college Name of school: n/a Contact person: n/a  Risk to self with the past 6 months Suicidal Ideation: Yes-Currently Present Has patient been a risk to self within the past 6 months prior to admission? : Yes Suicidal Intent: Yes-Currently Present Has patient had any suicidal intent within the past 6 months prior to admission? : Yes Is patient at risk for suicide?: Yes Suicidal Plan?: Yes-Currently Present (to jumpof of a bridge ) Has patient had any suicidal plan within the past 6 months prior to admission? : Yes Specify Current Suicidal Plan: OD Access to Means: Yes Specify Access to Suicidal Means: Personal Medications What has been your use of drugs/alcohol within the last 12 months?: Cociane  Previous Attempts/Gestures: Yes How many times?: 43 Other Self Harm Risks: drug use  Triggers for Past Attempts: Unpredictable  Intentional Self Injurious Behavior: None Family Suicide History: Yes Recent stressful life event(s): Financial Problems, Conflict (Comment), Divorce Persecutory  voices/beliefs?: No Depression: No Depression Symptoms: Fatigue, Loss of interest in usual pleasures, Feeling worthless/self pity, Tearfulness Substance abuse history and/or treatment for substance abuse?: Yes Suicide prevention information given to non-admitted patients: Not applicable  Risk to Others within the past 6 months Homicidal Ideation: No Does patient have any lifetime risk of violence toward others beyond the six months prior to admission? : No Thoughts of Harm to Others: No Current Homicidal Intent: No Current Homicidal Plan: No Access to Homicidal Means: No (n/a) Identified Victim: n/a History of harm to others?: No Assessment of Violence: None Noted Violent Behavior Description: n/a Does patient have access to weapons?: No Criminal Charges Pending?: No Does patient have a court date: No Is patient on probation?: No  Psychosis Hallucinations: None noted Delusions: None noted  Mental Status Report Appearance/Hygiene: Disheveled Eye Contact: Fair Motor Activity: Freedom of movement Speech: Logical/coherent Level of Consciousness: Alert Mood: Depressed Affect: Depressed Anxiety Level: None Thought Processes: Coherent Judgement: Impaired Orientation: Situation, Time, Place, Person Obsessive Compulsive Thoughts/Behaviors: None  Cognitive Functioning Concentration: Good Memory: Remote Intact, Recent Intact IQ: Average Insight: Poor Impulse Control: Poor Appetite: Good Weight Loss: 0 Weight Gain: 0 Sleep: No Change Total Hours of Sleep: 5 Vegetative Symptoms: None  ADLScreening [facility] Assessment Services) Patient's cognitive ability adequate to safely complete daily activities?: Yes Patient able to express need for assistance with ADLs?: Yes Independently performs ADLs?: Yes (appropriate for developmental age)  Prior Inpatient Therapy Prior Inpatient Therapy: Yes Prior Therapy Dates: Unknown Prior Therapy Facilty/Provider(s): Unknown  Reason for  Treatment: Unknown   Prior Outpatient Therapy Prior Outpatient Therapy: Yes Prior Therapy Dates: 04/2016 Prior Therapy Facilty/Provider(s): [facility] Reason for Treatment: Bipolar  Does patient have an ACCT team?: No Does patient have Intensive In-House Services?  : No Does patient have [facility] services? : No Does patient have P4CC services?: No  ADL Screening (condition at time of admission) Patient's cognitive ability adequate to safely complete daily activities?: Yes Patient able to express need for assistance with ADLs?: Yes Independently performs ADLs?: Yes (appropriate for developmental age)       Abuse/Neglect Assessment (Assessment to be complete while patient is alone) Physical Abuse: Denies Verbal Abuse: Denies Sexual Abuse: Denies Exploitation of patient/patient's resources: Denies Self-Neglect: Denies Values / Beliefs Cultural Requests During Hospitalization: None Spiritual Requests During Hospitalization: None Consults Spiritual Care Consult Needed: No Social Work Consult Needed: No Merchant navy officer (For Healthcare) Does Patient Have a Medical Advance Directive?: No    Additional Information 1:1 In Past 12 Months?: No CIRT Risk: No Elopement Risk: No Does patient have medical clearance?: Yes     Disposition:  Disposition Initial Assessment Completed for this Encounter: Yes Disposition of Patient: Inpatient treatment program Type of inpatient treatment program: Adult  On Site Evaluation by:   Reviewed with Physician:    Asa Saunas 08/12/2016 3:35 PM

## 2016-08-12 NOTE — Consult Note (Signed)
[facility] Consult   Reason for Consult:  Consult for 41 year old woman who presented voluntarily to the emergency room reporting suicidal thoughts Referring Physician:  Mariea Clonts Patient Identification: Tracie Hodge MRN:  [id number] Principal Diagnosis: Severe recurrent major depression without psychotic features Tracie Hodge) Diagnosis:   Patient Active Problem List   Diagnosis Date Noted  . Severe recurrent major depression without psychotic features ([place]) [F33.2] 08/12/2016  . 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 PTSD (post-traumatic stress disorder) [F43.10] 08/06/2016  . GERD (gastroesophageal reflux disease) [K21.9] 08/06/2016  . Cocaine abuse [F14.10] 08/06/2016    Total Time spent with patient: 1 hour  Subjective:   Tracie Hodge is a 41 y.o. female patient admitted with "I am not doing so good".  HPI:  Patient interviewed chart reviewed and case reviewed with TTS and emergency room physician. 41 year old woman who presents to the emergency room for the third time in a week. This time she is saying that she really does want to kill her self. The last couple time she was here she was clearly abusing drugs and that were indications that she had overdosed but she was denying any actual wish to die. Patient comes in now saying she is depressed and wants to die. Has no place to live. Not sleeping well. She admits that she used some crack cocaine yesterday. Denies that she is drinking or using any other drugs. Patient talks about having overdosed on Tylenol PM. This is the same complaint she had 2 or 3 days ago when she came in. When I ask her whether she's referring to that same of then she said yes although she gave the emergency room physician at different impression. In any case there is no acetaminophen in her system. No indication that she followed up with any recommended outpatient treatment.  Social history: Married but her husband is in prison.  Sounds like her husband's family has had her put out of the trailer she was staying in or else she may have just abandon it. She says she just been staying with people the last couple days.  Medical history: Overweight. History of gastric reflux. Generally in pretty good health otherwise.  Substance abuse history: Long history of substance abuse multiple presentations to the emergency room going back years. This last week she is been here 3 times in a row. Prior to that we had not seen her for quite a while. It looks like the last admission may been a few years ago. Patient has had compliance with outpatient substance abuse treatment in the past but currently is clearly continuing to abuse cocaine.  Past Psychiatric History: Patient has presented in the past with symptoms of depression and drug abuse and posttraumatic stress disorder. First time she came in here about a week ago she was mostly complaining of mood symptoms and was given antidepressants to restart. A couple days ago she came back with an allegedly overdose of Benadryl. Now she is coming back admitting suicidal ideation. Sounds like a history of chaotic behavior a lot of which is probably related to the effects of her substance abuse. Does have a positive history of suicide attempts in the past  Risk to Self: Is patient at risk for suicide?: Yes Risk to Others:   Prior Inpatient Therapy:   Prior Outpatient Therapy:    Past Medical History:  Past Medical History:  Diagnosis Date  . Anxiety   . Asthma   . Depression   . GERD (  gastroesophageal reflux disease)   . Mental disorder   . Sleep apnea    sleep study done ? 3 years ago, does not use c-pap    Past Surgical History:  Procedure Laterality Date  . ABDOMINAL HYSTERECTOMY    . CESAREAN SECTION    . CHOLECYSTECTOMY N/A 02/07/2013   Procedure: LAPAROSCOPIC CHOLECYSTECTOMY;  Surgeon: Ralene Ok, MD;  Location: [place];  Service: General;  Laterality: N/A;   Family History:  No family history on file. Family Psychiatric  History: Positive for substance abuse Social History:  History  Alcohol Use  . Yes    Comment: unable to answer due to status of pt     History  Drug Use  . Types: Cocaine    Social History   Social History  . Marital status: Married    Spouse name: N/A  . Number of children: N/A  . Years of education: N/A   Social History Main Topics  . Smoking status: Never Smoker  . Smokeless tobacco: Never Used  . Alcohol use Yes     Comment: unable to answer due to status of pt  . Drug use: Yes    Types: Cocaine  . Sexual activity: Not Asked   Other Topics Concern  . None   Social History Narrative   ** Merged History Encounter **       Additional Social History:    Allergies:  No Known Allergies  Labs:  Results for orders placed or performed during the hospital encounter of 08/12/16 (from the past 48 hour(s))  Comprehensive metabolic panel     Status: Abnormal   Collection Time: 08/12/16  1:24 PM  Result Value Ref Range   Sodium 141 135 - 145 mmol/L   Potassium 3.1 (L) 3.5 - 5.1 mmol/L   Chloride 104 101 - 111 mmol/L   CO2 28 22 - 32 mmol/L   Glucose, Bld 104 (H) 65 - 99 mg/dL   BUN <5 (L) 6 - 20 mg/dL   Creatinine, Ser 0.68 0.44 - 1.00 mg/dL   Calcium 9.1 8.9 - 10.3 mg/dL   Total Protein 7.7 6.5 - 8.1 g/dL   Albumin 4.1 3.5 - 5.0 g/dL   AST 17 15 - 41 U/L   ALT 14 14 - 54 U/L   Alkaline Phosphatase 103 38 - 126 U/L   Total Bilirubin 0.3 0.3 - 1.2 mg/dL   GFR calc non Af Amer >60 >60 mL/min   GFR calc Af Amer >60 >60 mL/min    Comment: (NOTE) The eGFR has been calculated using the CKD EPI equation. This calculation has not been validated in all clinical situations. eGFR's persistently <60 mL/min signify possible Chronic Kidney Disease.    Anion gap 9 5 - 15  Ethanol     Status: None   Collection Time: 08/12/16  1:24 PM  Result Value Ref Range   Alcohol, Ethyl (B) <5 <5 mg/dL    Comment:        LOWEST  DETECTABLE LIMIT FOR SERUM ALCOHOL IS 5 mg/dL FOR MEDICAL PURPOSES ONLY   Salicylate level     Status: None   Collection Time: 08/12/16  1:24 PM  Result Value Ref Range   Salicylate Lvl <0.7 2.8 - 30.0 mg/dL  Acetaminophen level     Status: Abnormal   Collection Time: 08/12/16  1:24 PM  Result Value Ref Range   Acetaminophen (Tylenol), Serum <10 (L) 10 - 30 ug/mL    Comment:  THERAPEUTIC CONCENTRATIONS VARY SIGNIFICANTLY. A RANGE OF 10-30 ug/mL MAY BE AN EFFECTIVE CONCENTRATION FOR MANY PATIENTS. HOWEVER, SOME ARE BEST TREATED AT CONCENTRATIONS OUTSIDE THIS RANGE. ACETAMINOPHEN CONCENTRATIONS >150 ug/mL AT 4 HOURS AFTER INGESTION AND >50 ug/mL AT 12 HOURS AFTER INGESTION ARE OFTEN ASSOCIATED WITH TOXIC REACTIONS.   cbc     Status: Abnormal   Collection Time: 08/12/16  1:24 PM  Result Value Ref Range   WBC 12.1 (H) 3.6 - 11.0 K/uL   RBC 4.46 3.80 - 5.20 MIL/uL   Hemoglobin 13.1 12.0 - 16.0 g/dL   HCT 45.3 06.3 - 16.7 %   MCV 87.4 80.0 - 100.0 fL   MCH 29.4 26.0 - 34.0 pg   MCHC 33.7 32.0 - 36.0 g/dL   RDW 77.3 17.9 - 15.2 %   Platelets 331 150 - 440 K/uL    Current Facility-Administered Medications  Medication Dose Route Frequency Provider Last Rate Last Dose  . potassium chloride SA (K-DUR,KLOR-CON) CR tablet 40 mEq  40 mEq Oral Once Rockne Menghini, MD       Current Outpatient Prescriptions  Medication Sig Dispense Refill  . albuterol (PROVENTIL HFA;VENTOLIN HFA) 108 (90 Base) MCG/ACT inhaler Inhale 2 puffs into the lungs every 6 (six) hours as needed for wheezing or shortness of breath. 1 Inhaler 2  . albuterol (PROVENTIL HFA;VENTOLIN HFA) 108 (90 Base) MCG/ACT inhaler Inhale 2 puffs into the lungs every 6 (six) hours as needed for wheezing or shortness of breath.    Marland Kitchen buPROPion (WELLBUTRIN XL) 150 MG 24 hr tablet Take 150 mg by mouth daily. Take with 300 mg tablet for a 450 mg dose    . buPROPion (WELLBUTRIN XL) 300 MG 24 hr tablet Take 300 mg by mouth  daily. Take with 150 mg tablet for a 450 mg dose    . cyclobenzaprine (FLEXERIL) 10 MG tablet Take 1 tablet (10 mg total) by mouth 3 (three) times daily as needed for muscle spasms. 30 tablet 0  . escitalopram (LEXAPRO) 10 MG tablet Take 1 tablet (10 mg total) by mouth daily. 30 tablet 0  . escitalopram (LEXAPRO) 10 MG tablet Take 10 mg by mouth daily.    Marland Kitchen FLUoxetine (PROZAC) 20 MG capsule Take 1 capsule (20 mg total) by mouth daily. 30 capsule 1  . FLUoxetine (PROZAC) 20 MG capsule Take 20 mg by mouth daily.    Marland Kitchen HYDROcodone-acetaminophen (NORCO/VICODIN) 5-325 MG tablet Take 1 tablet by mouth every 6 (six) hours as needed for moderate pain. 12 tablet 0  . ibuprofen (ADVIL,MOTRIN) 600 MG tablet Take 1 tablet (600 mg total) by mouth every 8 (eight) hours as needed. 30 tablet 0  . naproxen (NAPROSYN) 500 MG tablet Take 1 tablet (500 mg total) by mouth 2 (two) times daily with a meal. 20 tablet 00  . ondansetron (ZOFRAN) 8 MG tablet Take 1 tablet (8 mg total) by mouth every 8 (eight) hours as needed for nausea. 20 tablet 0  . oxyCODONE-acetaminophen (PERCOCET) 7.5-325 MG tablet Take 1 tablet by mouth every 4 (four) hours as needed for severe pain. 20 tablet 0  . predniSONE (STERAPRED UNI-PAK 21 TAB) 10 MG (21) TBPK tablet Dispense steroid taper pack as directed 21 tablet 0  . promethazine (PHENERGAN) 25 MG tablet Take 25 mg by mouth every 6 (six) hours as needed for nausea.    . ranitidine (ZANTAC) 150 MG tablet Take 150 mg by mouth at bedtime.    . ranitidine (ZANTAC) 150 MG tablet Take 150 mg  by mouth at bedtime.    . traZODone (DESYREL) 100 MG tablet Take 1 tablet (100 mg total) by mouth at bedtime. 30 tablet 1  . traZODone (DESYREL) 100 MG tablet Take 100 mg by mouth at bedtime.    . trazodone (DESYREL) 300 MG tablet Take 300 mg by mouth at bedtime.      Musculoskeletal: Strength & Muscle Tone: within normal limits Gait & Station: normal Patient leans: N/A  Psychiatric Specialty  Exam: Physical Exam  Nursing note and vitals reviewed. Constitutional: She appears well-developed and well-nourished.  HENT:  Head: Normocephalic and atraumatic.  Eyes: Conjunctivae are normal. Pupils are equal, round, and reactive to light.  Neck: Normal range of motion.  Cardiovascular: Regular rhythm and normal heart sounds.   Respiratory: Effort normal. No respiratory distress.  GI: Soft.  Musculoskeletal: Normal range of motion.  Neurological: She is alert.  Skin: Skin is warm and dry.  Psychiatric: Her affect is blunt. Her speech is delayed. She is slowed and withdrawn. Cognition and memory are impaired. She expresses impulsivity. She exhibits a depressed mood. She expresses suicidal ideation.    Review of Systems  Constitutional: Negative.   HENT: Negative.   Eyes: Negative.   Respiratory: Negative.   Cardiovascular: Negative.   Gastrointestinal: Negative.   Musculoskeletal: Negative.   Skin: Negative.   Neurological: Negative.   Psychiatric/Behavioral: Positive for depression, memory loss, substance abuse and suicidal ideas. Negative for hallucinations. The patient is nervous/anxious and has insomnia.     Blood pressure 128/75, pulse 88, temperature 98.4 F (36.9 C), temperature source Oral, resp. rate 20, height '5\' 2"'$  (1.575 m), weight 81.6 kg (180 lb), SpO2 92 %.Body mass index is 32.92 kg/m.  General Appearance: Disheveled  Eye Contact:  Fair  Speech:  Slow  Volume:  Decreased  Mood:  Depressed  Affect:  Constricted  Thought Process:  Goal Directed  Orientation:  Full (Time, Place, and Person)  Thought Content:  Rumination  Suicidal Thoughts:  Yes.  with intent/plan  Homicidal Thoughts:  No  Memory:  Immediate;   Good Recent;   Poor Remote;   Fair  Judgement:  Impaired  Insight:  Lacking  Psychomotor Activity:  Decreased  Concentration:  Concentration: Poor  Recall:  [place] of Knowledge:  Good  Language:  Fair  Akathisia:  No  Handed:  Right  AIMS  (if indicated):     Assets:  Physical Health  ADL's:  Intact  Cognition:  Impaired,  Mild  Sleep:        Treatment Plan Summary: Daily contact with patient to assess and evaluate symptoms and progress in treatment, Medication management and Plan Patient will be admitted voluntarily to the psychiatry ward. Continue fluoxetine and trazodone as previously ordered. Full set of labs and 15 minute checks and regular group and individual assessment.  Disposition: Recommend psychiatric Inpatient admission when medically cleared.  Alethia Berthold, MD 08/12/2016 3:24 PM

## 2016-08-12 NOTE — ED Triage Notes (Signed)
Pt reports feeling suicidal. Pt states has several plans to hurt herself. Pt reports she thought about jumping off of a bridge. Pt reports two days ago she took an entire bottle of Tylenol PM. Pt denies seeking any treatment after doing so. Pt tearful in triage. Pt is here voluntarily.

## 2016-08-12 NOTE — ED Notes (Signed)

## 2016-08-12 NOTE — ED Notes (Signed)
BEHAVIORAL HEALTH ROUNDING Patient sleeping: Yes.   Patient alert and oriented: eyes closed  Appears to be asleep  Snoring can be heard  Behavior appropriate: Yes.  ; If no, describe:  Nutrition and fluids offered: Yes  Toileting and hygiene offered: sleeping Sitter present: q 15 minute observations and security monitoring Law enforcement present: yes  ODS 

## 2016-08-12 NOTE — ED Notes (Signed)
BEHAVIORAL HEALTH ROUNDING Patient sleeping: No. Patient alert and oriented: yes Behavior appropriate: Yes.  ; If no, describe:  Nutrition and fluids offered: yes Toileting and hygiene offered: Yes  Sitter present: q15 minute observations and security  monitoring Law enforcement present: Yes  ODS  

## 2016-08-12 NOTE — ED Notes (Signed)
Ginger ale was given to patient 

## 2016-08-12 NOTE — ED Notes (Signed)
Snack and beverage given. 

## 2016-08-12 NOTE — ED Notes (Signed)
Report to [facility]

## 2016-08-12 NOTE — ED Provider Notes (Addendum)
[facility] Emergency Department Provider Note  ____________________________________________  Time seen: Approximately 2:19 PM  I have reviewed the triage vital signs and the nursing notes.   HISTORY  Chief Complaint Suicidal    HPI Tracie Hodge is a 41 y.o. female with a history of depression and anxiety, PTSD, and polysubstance abuse presenting for suicidal ideation. The patient reports that her depression has been worsening recently. She was recently restarted on her Prozac and trazodone in the last week. She reports that 3-4 days ago she took 80 tablets of Tylenol PM did not have any vomiting afterwards. She has not taken any additional medicationsbut continues to have thoughts of killing herself.  She has no medical complaints at this time.   Past Medical History:  Diagnosis Date  . Anxiety   . Asthma   . Depression   . GERD (gastroesophageal reflux disease)   . Mental disorder   . Sleep apnea    sleep study done ? 3 years ago, does not use c-pap    Patient Active Problem List   Diagnosis Date Noted  . Overdose of sedative or hypnotic 08/10/2016  . PTSD (post-traumatic stress disorder) 08/06/2016  . GERD (gastroesophageal reflux disease) 08/06/2016  . Cocaine abuse 08/06/2016    Past Surgical History:  Procedure Laterality Date  . ABDOMINAL HYSTERECTOMY    . CESAREAN SECTION    . CHOLECYSTECTOMY N/A 02/07/2013   Procedure: LAPAROSCOPIC CHOLECYSTECTOMY;  Surgeon: Axel Filler, MD;  Location: [facility] OR;  Service: General;  Laterality: N/A;    Current Outpatient Rx  . Order #: [id number] Class: Print  . Order #: [id number] Class: Historical Med  . Order #: [id number] Class: Historical Med  . Order #: [id number] Class: Historical Med  . Order #: [id number] Class: Print  . Order #: [id number] Class: Print  . Order #: [id number] Class: Historical Med  . Order #: [id number] Class: Print  . Order #: [id number] Class: Historical Med  . Order #:  [id number] Class: Print  . Order #: [id number] Class: Print  . Order #: [id number] Class: Print  . Order #: [id number] Class: Print  . Order #: [id number] Class: Print  . Order #: [id number] Class: Print  . Order #: [id number] Class: Historical Med  . Order #: [id number] Class: Historical Med  . Order #: [id number] Class: Historical Med  . Order #: [id number] Class: Print  . Order #: [id number] Class: Historical Med  . Order #: [id number] Class: Historical Med    Allergies Patient has no known allergies.  No family history on file.  Social History Social History  Substance Use Topics  . Smoking status: Never Smoker  . Smokeless tobacco: Never Used  . Alcohol use Yes     Comment: unable to answer due to status of pt    Review of Systems Constitutional: No fever/chills. Eyes: No visual changes. ENT: No sore throat. No congestion or rhinorrhea. Cardiovascular: Denies chest pain. Denies palpitations. Respiratory: Denies shortness of breath.  No cough. Gastrointestinal: No abdominal pain.  No nausea, no vomiting.  No diarrhea.  No constipation. Genitourinary: Negative for dysuria. Musculoskeletal: Negative for back pain. Skin: Negative for rash. Neurological: Negative for headaches. No focal numbness, tingling or weakness.  Psychiatric:Positive suicidal ideation with overdose attempt. No HI or hallucinations.  10-point ROS otherwise negative.  ____________________________________________   PHYSICAL EXAM:  VITAL SIGNS: ED Triage Vitals  Enc Vitals Group     BP 08/12/16 1317 128/75     Pulse Rate 08/12/16 1317 88     Resp 08/12/16 1317 20     Temp 08/12/16 1317 98.4 F (36.9  C)     Temp Source 08/12/16 1317 Oral     SpO2 08/12/16 1317 92 %     Weight 08/12/16 1321 180 lb (81.6 kg)     Height 08/12/16 1321  (1.575 m)     Head Circumference --      Peak Flow --      Pain Score 08/12/16 1321 0     Pain Loc --      Pain Edu? --      Excl. in GC? --     Constitutional: Alert and  oriented. Well appearing and in no acute distress. Answers questions appropriately. Eyes: Conjunctivae are normal.  EOMI. No scleral icterus. Head: Atraumatic. Nose: No congestion/rhinnorhea. Mouth/Throat: Mucous membranes are moist.  Neck: No stridor.  Supple.   Cardiovascular: Normal rate, regular rhythm. No murmurs, rubs or gallops.  Respiratory: Normal respiratory effort.  No accessory muscle use or retractions. Lungs CTAB.  No wheezes, rales or ronchi. Gastrointestinal: Obese. Soft, nontender and nondistended.  No guarding or rebound.  No peritoneal signs. Musculoskeletal: No LE edema. No ttp in the calves or palpable cords.  Negative Homan's sign. Neurologic:  A&Ox3.  Speech is clear.  Face and smile are symmetric.  EOMI.  Moves all extremities well. Skin:  Skin is warm, dry and intact. No rash noted. No jaundice. Psychiatric: Depressed mood and flat affect. Speech pattern.  ____________________________________________   LABS (all labs ordered are listed, but only abnormal results are displayed)  Labs Reviewed  CBC - Abnormal; Notable for the following:       Result Value   WBC 12.1 (*)    All other components within normal limits  COMPREHENSIVE METABOLIC PANEL  ETHANOL  SALICYLATE LEVEL  ACETAMINOPHEN LEVEL  URINE DRUG SCREEN, QUALITATIVE ([facility] ONLY)   ____________________________________________  EKG  Not indicated ____________________________________________  RADIOLOGY  No results found.  ____________________________________________   PROCEDURES  Procedure(s) performed: None  Procedures  Critical Care performed: No ____________________________________________   INITIAL IMPRESSION / ASSESSMENT AND PLAN / ED COURSE  Pertinent labs & imaging results that were available during my care of the patient were reviewed by me and considered in my medical decision making (see chart for details).  41 y.o. female with a history of depression, anxiety, PTSD  presenting for some suicidal ideations with large ingestion of Tylenol several days ago. I'll plan to call poison control but and get LFTs as well as a acetaminophen level. In addition, will get drug screens, and salicylate level. Plan medical clearance for psychiatric disposition. Involuntary commitment initiated.  ----------------------------------------- 3:16 PM on 08/12/2016 -----------------------------------------  The patient's LFTs are within normal limits and her acetaminophen level is less than 10. She is medically cleared for psychiatric disposition. She has been seen by Dr. Delaney Meigs, the psychiatrist on-call, who will plan to admit her to the hospital for further evaluation and treatment.  ____________________________________________  FINAL CLINICAL IMPRESSION(S) / ED DIAGNOSES  Final diagnoses:  Intentional acetaminophen overdose, initial encounter [facility])  Suicidal ideation  Suicide attempt  Severe episode of recurrent major depressive disorder, without psychotic features (HCC)         NEW MEDICATIONS STARTED DURING THIS VISIT:  New Prescriptions   No medications on file      Rockne Menghini, MD 08/12/16 1423    Rockne Menghini, MD 08/12/16 1517

## 2016-08-12 NOTE — ED Notes (Signed)
Pt. Transferred to [facility] from ED to room 1 after screening for contraband. Report to include Situation, Background, Assessment and Recommendations from [facility]. Pt. Oriented to unit including Q15 minute rounds as well as the security cameras for their protection. Patient is alert and oriented, warm and dry in no acute distress. Patient denies SI, HI, and AVH. Pt. Encouraged to let me know if needs arise.

## 2016-08-13 ENCOUNTER — Inpatient Hospital Stay
Admit: 2016-08-13 | Discharge: 2016-08-20 | DRG: 885 | Disposition: A | Discharge status: Home / Self Care | Payer: No Typology Code available for payment source | Source: Intra-hospital | Attending: Psychiatry | Admitting: Psychiatry

## 2016-08-13 DIAGNOSIS — Z915 Personal history of self-harm: Secondary | ICD-10-CM | POA: Diagnosis not present

## 2016-08-13 DIAGNOSIS — Z91410 Personal history of adult physical and sexual abuse: Secondary | ICD-10-CM | POA: Diagnosis not present

## 2016-08-13 DIAGNOSIS — M5137 Other intervertebral disc degeneration, lumbosacral region: Secondary | ICD-10-CM | POA: Diagnosis present

## 2016-08-13 DIAGNOSIS — Z79899 Other long term (current) drug therapy: Secondary | ICD-10-CM

## 2016-08-13 DIAGNOSIS — Z814 Family history of other substance abuse and dependence: Secondary | ICD-10-CM

## 2016-08-13 DIAGNOSIS — E876 Hypokalemia: Secondary | ICD-10-CM | POA: Diagnosis present

## 2016-08-13 DIAGNOSIS — K219 Gastro-esophageal reflux disease without esophagitis: Secondary | ICD-10-CM | POA: Diagnosis present

## 2016-08-13 DIAGNOSIS — F1420 Cocaine dependence, uncomplicated: Secondary | ICD-10-CM | POA: Diagnosis present

## 2016-08-13 DIAGNOSIS — Z818 Family history of other mental and behavioral disorders: Secondary | ICD-10-CM

## 2016-08-13 DIAGNOSIS — J45909 Unspecified asthma, uncomplicated: Secondary | ICD-10-CM | POA: Diagnosis present

## 2016-08-13 DIAGNOSIS — Z590 Homelessness: Secondary | ICD-10-CM

## 2016-08-13 DIAGNOSIS — F4010 Social phobia, unspecified: Secondary | ICD-10-CM | POA: Diagnosis present

## 2016-08-13 DIAGNOSIS — F4310 Post-traumatic stress disorder, unspecified: Secondary | ICD-10-CM | POA: Diagnosis present

## 2016-08-13 DIAGNOSIS — F332 Major depressive disorder, recurrent severe without psychotic features: Principal | ICD-10-CM | POA: Diagnosis present

## 2016-08-13 DIAGNOSIS — F410 Panic disorder [episodic paroxysmal anxiety] without agoraphobia: Secondary | ICD-10-CM | POA: Diagnosis present

## 2016-08-13 DIAGNOSIS — G4700 Insomnia, unspecified: Secondary | ICD-10-CM | POA: Diagnosis present

## 2016-08-13 DIAGNOSIS — G4733 Obstructive sleep apnea (adult) (pediatric): Secondary | ICD-10-CM | POA: Diagnosis present

## 2016-08-13 DIAGNOSIS — Z6841 Body Mass Index (BMI) 40.0 and over, adult: Secondary | ICD-10-CM

## 2016-08-13 DIAGNOSIS — F17210 Nicotine dependence, cigarettes, uncomplicated: Secondary | ICD-10-CM | POA: Diagnosis present

## 2016-08-13 DIAGNOSIS — R45851 Suicidal ideations: Secondary | ICD-10-CM | POA: Diagnosis present

## 2016-08-13 DIAGNOSIS — Z90710 Acquired absence of both cervix and uterus: Secondary | ICD-10-CM | POA: Diagnosis not present

## 2016-08-13 DIAGNOSIS — Z9119 Patient's noncompliance with other medical treatment and regimen: Secondary | ICD-10-CM

## 2016-08-13 DIAGNOSIS — Z803 Family history of malignant neoplasm of breast: Secondary | ICD-10-CM

## 2016-08-13 DIAGNOSIS — M79606 Pain in leg, unspecified: Secondary | ICD-10-CM | POA: Diagnosis not present

## 2016-08-13 DIAGNOSIS — Z9049 Acquired absence of other specified parts of digestive tract: Secondary | ICD-10-CM | POA: Diagnosis not present

## 2016-08-13 DIAGNOSIS — F17200 Nicotine dependence, unspecified, uncomplicated: Secondary | ICD-10-CM

## 2016-08-13 DIAGNOSIS — Z791 Long term (current) use of non-steroidal anti-inflammatories (NSAID): Secondary | ICD-10-CM

## 2016-08-13 LAB — TSH: TSH: 0.931 u[IU]/mL (ref 0.350–4.500)

## 2016-08-13 LAB — LIPID PANEL
CHOL/HDL RATIO: 3.9 ratio
Cholesterol: 147 mg/dL (ref 0–200)
HDL: 38 mg/dL — AB (ref >40)
LDL CALC: 80 mg/dL (ref 0–99)
TRIGLYCERIDES: 144 mg/dL (ref <150)
VLDL: 29 mg/dL (ref 0–40)

## 2016-08-13 MED ORDER — TRAZODONE HCL 100 MG PO TABS
100.0000 mg | ORAL_TABLET | Freq: Every day | ORAL | Status: DC
  Administered 2016-08-13 – 2016-08-14 (×2): 100 mg via ORAL
  Filled 2016-08-13 (×2): qty 1

## 2016-08-13 MED ORDER — ALUM & MAG HYDROXIDE-SIMETH 200-200-20 MG/5ML PO SUSP: 30.0000 mL | ORAL | Status: DC | PRN

## 2016-08-13 MED ORDER — NICOTINE 21 MG/24HR TD PT24
21.0000 mg | MEDICATED_PATCH | Freq: Every day | TRANSDERMAL | Status: DC
  Administered 2016-08-13 – 2016-08-19 (×7): 21 mg via TRANSDERMAL
  Filled 2016-08-13 (×7): qty 1

## 2016-08-13 MED ORDER — CYCLOBENZAPRINE HCL 5 MG PO TABS
7.5000 mg | ORAL_TABLET | Freq: Three times a day (TID) | ORAL | Status: DC | PRN
  Administered 2016-08-14 – 2016-08-19 (×14): 7.5 mg via ORAL
  Filled 2016-08-13 (×15): qty 1.5

## 2016-08-13 MED ORDER — IBUPROFEN 800 MG PO TABS
800.0000 mg | ORAL_TABLET | Freq: Once | ORAL | Status: AC
  Administered 2016-08-13: 800 mg via ORAL
  Filled 2016-08-13: qty 1

## 2016-08-13 MED ORDER — PANTOPRAZOLE SODIUM 40 MG PO TBEC
40.0000 mg | DELAYED_RELEASE_TABLET | Freq: Every day | ORAL | Status: DC
  Administered 2016-08-13 – 2016-08-20 (×8): 40 mg via ORAL
  Filled 2016-08-13 (×8): qty 1

## 2016-08-13 MED ORDER — IBUPROFEN 600 MG PO TABS
600.0000 mg | ORAL_TABLET | Freq: Four times a day (QID) | ORAL | Status: DC | PRN
  Administered 2016-08-14 – 2016-08-19 (×12): 600 mg via ORAL
  Filled 2016-08-13 (×14): qty 1

## 2016-08-13 MED ORDER — CYCLOBENZAPRINE HCL 5 MG PO TABS
7.5000 mg | ORAL_TABLET | Freq: Once | ORAL | Status: AC
Start: 2016-08-13 — End: 2016-08-13
  Administered 2016-08-13: 7.5 mg via ORAL
  Filled 2016-08-13: qty 1.5

## 2016-08-13 MED ORDER — ACETAMINOPHEN 500 MG PO TABS
1000.0000 mg | ORAL_TABLET | Freq: Four times a day (QID) | ORAL | Status: DC | PRN
  Administered 2016-08-17 – 2016-08-18 (×2): 1000 mg via ORAL
  Filled 2016-08-13 (×2): qty 2

## 2016-08-13 MED ORDER — MAGNESIUM HYDROXIDE 400 MG/5ML PO SUSP: 30.0000 mL | Freq: Every day | ORAL | Status: DC | PRN

## 2016-08-13 MED ORDER — ACETAMINOPHEN 325 MG PO TABS: 650.0000 mg | ORAL_TABLET | Freq: Four times a day (QID) | ORAL | Status: DC | PRN

## 2016-08-13 MED ORDER — PRAZOSIN HCL 1 MG PO CAPS
1.0000 mg | ORAL_CAPSULE | Freq: Every day | ORAL | Status: DC
  Administered 2016-08-13 – 2016-08-19 (×7): 1 mg via ORAL
  Filled 2016-08-13 (×7): qty 1

## 2016-08-13 MED ORDER — POTASSIUM CHLORIDE CRYS ER 20 MEQ PO TBCR
40.0000 meq | EXTENDED_RELEASE_TABLET | Freq: Two times a day (BID) | ORAL | Status: AC
  Administered 2016-08-13 – 2016-08-14 (×4): 40 meq via ORAL
  Filled 2016-08-13 (×4): qty 2

## 2016-08-13 MED ORDER — ALBUTEROL SULFATE HFA 108 (90 BASE) MCG/ACT IN AERS
1.0000 | INHALATION_SPRAY | RESPIRATORY_TRACT | Status: DC | PRN
  Filled 2016-08-13: qty 6.7

## 2016-08-13 MED ORDER — FLUOXETINE HCL 10 MG PO CAPS
10.0000 mg | ORAL_CAPSULE | Freq: Every day | ORAL | Status: DC
  Administered 2016-08-13 – 2016-08-14 (×2): 10 mg via ORAL
  Filled 2016-08-13 (×2): qty 1

## 2016-08-13 NOTE — BHH Suicide Risk Assessment (Signed)
[facility] Admission Suicide Risk Assessment   Nursing information obtained from:  Patient Demographic factors:  Adolescent or young adult, Caucasian, Low socioeconomic status, Unemployed Current Mental Status:  NA Loss Factors:  Financial problems / change in socioeconomic status Historical Factors:  NA Risk Reduction Factors:  Positive social support  Total Time spent with patient: 1 hour Principal Problem: Severe recurrent major depression without psychotic features (HCC) Diagnosis:   Patient Active Problem List   Diagnosis Date Noted  . Cocaine use disorder, severe, dependence (HCC) [F14.20] 08/13/2016  . OSA (obstructive sleep apnea) [G47.33] 08/13/2016  . Tobacco use disorder [F17.200] 08/13/2016  . Hypokalemia [E87.6] 08/13/2016  . Asthma [J45.909] 08/13/2016  . Severe recurrent major depression without psychotic features (HCC) [F33.2] 08/12/2016  . PTSD (post-traumatic stress disorder) [F43.10] 08/06/2016  . GERD (gastroesophageal reflux disease) [K21.9] 08/06/2016   Subjective Data:   Continued Clinical Symptoms:  Alcohol Use Disorder Identification Test Final Score (AUDIT): 0 The "Alcohol Use Disorders Identification Test", Guidelines for Use in Primary Care, Second Edition.  World Science writer [facility]). Score between 0-7:  no or low risk or alcohol related problems. Score between 8-15:  moderate risk of alcohol related problems. Score between 16-19:  high risk of alcohol related problems. Score 20 or above:  warrants further diagnostic evaluation for alcohol dependence and treatment.   CLINICAL FACTORS:   Depression:   Hopelessness Impulsivity Insomnia Severe Alcohol/Substance Abuse/Dependencies Previous Psychiatric Diagnoses and Treatments     Psychiatric Specialty Exam: Physical Exam  ROS  Blood pressure 124/65, pulse 71, temperature 98.2 F (36.8 C), temperature source Oral, resp. rate 18, height  (1.626 m), weight 112 kg (247 lb), SpO2 95 %.Body mass  index is 42.4 kg/m.                                                    Sleep:  Number of Hours: 3.15      COGNITIVE FEATURES THAT CONTRIBUTE TO RISK:  Polarized thinking    SUICIDE RISK:   Moderate:  Frequent suicidal ideation with limited intensity, and duration, some specificity in terms of plans, no associated intent, good self-control, limited dysphoria/symptomatology, some risk factors present, and identifiable protective factors, including available and accessible social support.  PLAN OF CARE: admit to [facility]  I certify that inpatient services furnished can reasonably be expected to improve the patient's condition.   Jimmy Footman, MD 08/13/2016, 10:43 AM

## 2016-08-13 NOTE — Tx Team (Signed)
Interdisciplinary Treatment and Diagnostic Plan Update  08/13/2016 Time of Session: [place] MRN: [id number]  Principal Diagnosis: Severe recurrent major depression without psychotic features [facility])  Secondary Diagnoses: Principal Problem:   Severe recurrent major depression without psychotic features ([place]) Active Problems:   PTSD (post-traumatic stress disorder)   GERD (gastroesophageal reflux disease)   Cocaine use disorder, severe, dependence (HCC)   OSA (obstructive sleep apnea)   Tobacco use disorder   Hypokalemia   Asthma   Current Medications:  Current Facility-Administered Medications  Medication Dose Route Frequency Provider Last Rate Last Dose  . acetaminophen (TYLENOL) tablet 1,000 mg  1,000 mg Oral Q6H PRN Hildred Priest, MD      . albuterol (PROVENTIL HFA;VENTOLIN HFA) 108 (90 Base) MCG/ACT inhaler 1-2 puff  1-2 puff Inhalation Q4H PRN Hildred Priest, MD      . alum & mag hydroxide-simeth (MAALOX/MYLANTA) 200-200-20 MG/5ML suspension 30 mL  30 mL Oral Q4H PRN Gonzella Lex, MD      . cyclobenzaprine (FLEXERIL) tablet 7.5 mg  7.5 mg Oral Once Hildred Priest, MD      . cyclobenzaprine (FLEXERIL) tablet 7.5 mg  7.5 mg Oral TID PRN Hildred Priest, MD      . FLUoxetine (PROZAC) capsule 10 mg  10 mg Oral Daily Hildred Priest, MD   10 mg at 08/13/16 1053  . ibuprofen (ADVIL,MOTRIN) tablet 600 mg  600 mg Oral Q6H PRN Hildred Priest, MD      . magnesium hydroxide (MILK OF MAGNESIA) suspension 30 mL  30 mL Oral Daily PRN Gonzella Lex, MD      . nicotine (NICODERM CQ - dosed in mg/24 hours) patch 21 mg  21 mg Transdermal Daily Hildred Priest, MD   21 mg at 08/13/16 1053  . pantoprazole (PROTONIX) EC tablet 40 mg  40 mg Oral Daily Hildred Priest, MD   40 mg at 08/13/16 1053  . potassium chloride SA (K-DUR,KLOR-CON) CR tablet 40 mEq  40 mEq Oral BID Hildred Priest, MD    40 mEq at 08/13/16 1053  . prazosin (MINIPRESS) capsule 1 mg  1 mg Oral QHS Hildred Priest, MD      . traZODone (DESYREL) tablet 100 mg  100 mg Oral QHS Hildred Priest, MD       PTA Medications: Prescriptions Prior to Admission  Medication Sig Dispense Refill Last Dose  . [DISCONTINUED] albuterol (PROVENTIL HFA;VENTOLIN HFA) 108 (90 Base) MCG/ACT inhaler Inhale 2 puffs into the lungs every 6 (six) hours as needed for wheezing or shortness of breath. 1 Inhaler 2   . [DISCONTINUED] albuterol (PROVENTIL HFA;VENTOLIN HFA) 108 (90 Base) MCG/ACT inhaler Inhale 2 puffs into the lungs every 6 (six) hours as needed for wheezing or shortness of breath.   PRN at PRN  . [DISCONTINUED] buPROPion (WELLBUTRIN XL) 150 MG 24 hr tablet Take 150 mg by mouth daily. Take with 300 mg tablet for a 450 mg dose   Taking  . [DISCONTINUED] buPROPion (WELLBUTRIN XL) 300 MG 24 hr tablet Take 300 mg by mouth daily. Take with 150 mg tablet for a 450 mg dose   Taking  . [DISCONTINUED] cyclobenzaprine (FLEXERIL) 10 MG tablet Take 1 tablet (10 mg total) by mouth 3 (three) times daily as needed for muscle spasms. 30 tablet 0   . [DISCONTINUED] escitalopram (LEXAPRO) 10 MG tablet Take 1 tablet (10 mg total) by mouth daily. 30 tablet 0   . [DISCONTINUED] escitalopram (LEXAPRO) 10 MG tablet Take 10 mg by mouth daily.  UNKNOWN at UNKNOWN  . [DISCONTINUED] FLUoxetine (PROZAC) 20 MG capsule Take 1 capsule (20 mg total) by mouth daily. 30 capsule 1   . [DISCONTINUED] FLUoxetine (PROZAC) 20 MG capsule Take 20 mg by mouth daily.   UNKNOWN at UNKNOWN  . [DISCONTINUED] HYDROcodone-acetaminophen (NORCO/VICODIN) 5-325 MG tablet Take 1 tablet by mouth every 6 (six) hours as needed for moderate pain. 12 tablet 0   . [DISCONTINUED] ibuprofen (ADVIL,MOTRIN) 600 MG tablet Take 1 tablet (600 mg total) by mouth every 8 (eight) hours as needed. 30 tablet 0   . [DISCONTINUED] naproxen (NAPROSYN) 500 MG tablet Take 1 tablet (500 mg  total) by mouth 2 (two) times daily with a meal. 20 tablet 00   . [DISCONTINUED] ondansetron (ZOFRAN) 8 MG tablet Take 1 tablet (8 mg total) by mouth every 8 (eight) hours as needed for nausea. 20 tablet 0 Not Taking  . [DISCONTINUED] oxyCODONE-acetaminophen (PERCOCET) 7.5-325 MG tablet Take 1 tablet by mouth every 4 (four) hours as needed for severe pain. 20 tablet 0   . [DISCONTINUED] predniSONE (STERAPRED UNI-PAK 21 TAB) 10 MG (21) TBPK tablet Dispense steroid taper pack as directed 21 tablet 0   . [DISCONTINUED] promethazine (PHENERGAN) 25 MG tablet Take 25 mg by mouth every 6 (six) hours as needed for nausea.   Not Taking  . [DISCONTINUED] ranitidine (ZANTAC) 150 MG tablet Take 150 mg by mouth at bedtime.   Taking  . [DISCONTINUED] ranitidine (ZANTAC) 150 MG tablet Take 150 mg by mouth at bedtime.   UNKNOWN at UNKNOWN  . [DISCONTINUED] traZODone (DESYREL) 100 MG tablet Take 1 tablet (100 mg total) by mouth at bedtime. 30 tablet 1   . [DISCONTINUED] traZODone (DESYREL) 100 MG tablet Take 100 mg by mouth at bedtime.   UNKNOWN at UNKNOWN  . [DISCONTINUED] trazodone (DESYREL) 300 MG tablet Take 300 mg by mouth at bedtime.   Taking    Patient Stressors: Financial difficulties Marital or family conflict Substance abuse  Patient Strengths: Technical sales engineer for treatment/growth  Treatment Modalities: Medication Management, Group therapy, Case management,  1 to 1 session with clinician, Psychoeducation, Recreational therapy.   Physician Treatment Plan for Primary Diagnosis: Severe recurrent major depression without psychotic features ([place]) Long Term Goal(s): Improvement in symptoms so as ready for discharge Improvement in symptoms so as ready for discharge   Short Term Goals: Ability to identify changes in lifestyle to reduce recurrence of condition will improve Ability to demonstrate self-control will improve Ability to identify triggers associated with substance  abuse/mental health issues will improve Ability to identify and develop effective coping behaviors will improve  Medication Management: Evaluate patient's response, side effects, and tolerance of medication regimen.  Therapeutic Interventions: 1 to 1 sessions, Unit Group sessions and Medication administration.  Evaluation of Outcomes: Not Met  Physician Treatment Plan for Secondary Diagnosis: Principal Problem:   Severe recurrent major depression without psychotic features ([place]) Active Problems:   PTSD (post-traumatic stress disorder)   GERD (gastroesophageal reflux disease)   Cocaine use disorder, severe, dependence (HCC)   OSA (obstructive sleep apnea)   Tobacco use disorder   Hypokalemia   Asthma  Long Term Goal(s): Improvement in symptoms so as ready for discharge Improvement in symptoms so as ready for discharge   Short Term Goals: Ability to identify changes in lifestyle to reduce recurrence of condition will improve Ability to demonstrate self-control will improve Ability to identify triggers associated with substance abuse/mental health issues will improve Ability to identify and develop effective  coping behaviors will improve     Medication Management: Evaluate patient's response, side effects, and tolerance of medication regimen.  Therapeutic Interventions: 1 to 1 sessions, Unit Group sessions and Medication administration.  Evaluation of Outcomes: Not Met   RN Treatment Plan for Primary Diagnosis: Severe recurrent major depression without psychotic features ([place]) Long Term Goal(s): Knowledge of disease and therapeutic regimen to maintain health will improve  Short Term Goals: Ability to remain free from injury will improve, Ability to disclose and discuss suicidal ideas, Ability to identify and develop effective coping behaviors will improve and Compliance with prescribed medications will improve  Medication Management: RN will administer medications as ordered by  provider, will assess and evaluate patient's response and provide education to patient for prescribed medication. RN will report any adverse and/or side effects to prescribing provider.  Therapeutic Interventions: 1 on 1 counseling sessions, Psychoeducation, Medication administration, Evaluate responses to treatment, Monitor vital signs and CBGs as ordered, Perform/monitor CIWA, COWS, AIMS and Fall Risk screenings as ordered, Perform wound care treatments as ordered.  Evaluation of Outcomes: Not Met   LCSW Treatment Plan for Primary Diagnosis: Severe recurrent major depression without psychotic features ([place]) Long Term Goal(s): Safe transition to appropriate next level of care at discharge, Engage patient in therapeutic group addressing interpersonal concerns.  Short Term Goals: Engage patient in aftercare planning with referrals and resources and Increase social support  Therapeutic Interventions: Assess for all discharge needs, 1 to 1 time with Social worker, Explore available resources and support systems, Assess for adequacy in community support network, Educate family and significant other(s) on suicide prevention, Complete Psychosocial Assessment, Interpersonal group therapy.  Evaluation of Outcomes: Not Met   Progress in Treatment: Attending groups: No. Participating in groups: No. Taking medication as prescribed: Yes. Toleration medication: Yes. Family/Significant other contact made: No, will contact:  when given permission Patient understands diagnosis: Yes. Discussing patient identified problems/goals with staff: No. Medical problems stabilized or resolved: Yes. Denies suicidal/homicidal ideation: Yes. Issues/concerns per patient self-inventory: No. Other: none  New problem(s) identified: No, Describe:  none  New Short Term/Long Term Goal(s): Pt goal: to get myself mentally stable, to believe in myself more.  Discharge Plan or Barriers: CSW assessing for appropriate  plan.  Reason for Continuation of Hospitalization: Depression Medication stabilization  Estimated Length of Stay: 3-5 days.  Attendees: Patient: Tracie Hodge 08/13/2016   Physician: Dr. Jerilee Hoh, MD 08/13/2016   Nursing: Elige Radon, RN 08/13/2016   RN Care Manager: 08/13/2016   Social Worker: Lurline Idol, LCSW 08/13/2016   Recreational Therapist: Everitt Amber, LRT/CTRS  08/13/2016   Other:  08/13/2016   Other:  08/13/2016   Other: 08/13/2016        Scribe for Treatment Team: Joanne Chars, [place] 08/13/2016 2:11 PM

## 2016-08-13 NOTE — Progress Notes (Signed)
Recreation Therapy Notes  At approximately 11:30 am, LRT attempted assessment. Patient reported she was not feeling well and requested for LRT to come back another time.  Jacquelynn Cree, LRT/CTRS 08/13/2016 2:42 PM

## 2016-08-13 NOTE — Progress Notes (Signed)
Recreation Therapy Notes  Date: 04.13.18 Time: 1:00 pm Location: Craft Room  Group Topic: Social Skills  Goal Area(s) Addresses:  Patient will effectively work with peer towards shared goal. Patient will identify skills used to make activity successful. Patient will identify how skills used during activity can be used to reach post d/c goals.  Behavioral Response: Did not attend  Intervention: Life Boat  Activity: Patients were given a scenario that we were in [place], [place] and we decided to take a yacht to go sight seeing. While on the yacht, there was a leak and the patients were put in charge of figuring out where to put everyone. There are 16 people (Beyonce, nurse, teacher, etc.) that the patients have to decide where to put them. There is a nicer bigger boat that hold everyone in the room plus 8 people and a raft that holds the other 8 people.  Education: LRT educated patients on healthy support systems.  Education Outcome: Patient did not attend group.  Clinical Observations/Feedback: Patient did not attend group.  Paris [place] M, LRT/CTRS 08/13/2016 1:49 PM 

## 2016-08-13 NOTE — BHH Counselor (Signed)
Attempt to meet with pt for PSA at 8:15 and 1130am.  Pt reporting she is sick and feels weak.  Unable to complete today. Garner Nash, MSW, LCSW Clinical Social Worker 08/13/2016 11:33 AM
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
## 2016-08-13 NOTE — Progress Notes (Signed)
D: Pt  SI-contracts for safety denies HI/AVH. Pt is pleasant and cooperative. Pt tearful earlier because she did not know if someone would be able to get her stuff from her place. Pt had some sentimental things that means a lot to her, so she was very tearful about possibly losing them.   A: Pt was offered support and encouragement. Pt was given scheduled medications. Pt was encourage to attend groups. Q 15 minute checks were done for safety.   R:Pt attends groups and interacts well with peers and staff. Pt is taking medication. Pt has no complaints.Pt receptive to treatment and safety maintained on unit.

## 2016-08-13 NOTE — Progress Notes (Signed)
Pt isolative to self and room. Requests to remain in bed due to weakness and back pain. Md notified. Medications ordered.  Encouragement and support offered. Encouraged patient to come out of room. Pt refused to come out of room. Med compliant. Remains safe on unit with q 15 min checks.

## 2016-08-13 NOTE — Tx Team (Signed)
Initial Treatment Plan 08/13/2016 4:12 AM Tracie Hodge [id number]    PATIENT STRESSORS: Financial difficulties Marital or family conflict Substance abuse   PATIENT STRENGTHS: General fund of knowledge Motivation for treatment/growth   PATIENT IDENTIFIED PROBLEMS: Substance abuse    Depression                 DISCHARGE CRITERIA:  Adequate post-discharge living arrangements Improved stabilization in mood, thinking, and/or behavior Motivation to continue treatment in a less acute level of care  PRELIMINARY DISCHARGE PLAN: Outpatient therapy Participate in family therapy Placement in alternative living arrangements  PATIENT/FAMILY INVOLVEMENT: This treatment plan has been presented to and reviewed with the patient, Tracie Hodge, Early patient and family have been given the opportunity to ask questions and make suggestions.  Trula Ore, RN 08/13/2016, 4:12 AM

## 2016-08-13 NOTE — Plan of Care (Signed)
Problem: Safety: Goal: Periods of time without injury will increase Outcome: Progressing Pt safe on the unit at this time   

## 2016-08-13 NOTE — Progress Notes (Signed)
Patient has declined cpap for the night per RN Kathlene November.

## 2016-08-13 NOTE — Plan of Care (Signed)
Problem: Education: Goal: Verbalization of understanding the information provided will improve Outcome: Progressing Patient verbalized understanding of treatment been provided.
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
## 2016-08-13 NOTE — ED Notes (Signed)
Hourly rounding reveals patient sleeping in room. No complaints, stable, in no acute distress. Q15 minute rounds and monitoring via Security Cameras to continue. 

## 2016-08-13 NOTE — [facility] Group Notes (Signed)
[facility] LCSW Group Therapy  08/13/2016 10:20 AM  Type of Therapy:  Group Therapy  Participation Level:  Patient did not attend group. CSW invited patient to group.   Summary of Progress/Problems: Stress management: Patients defined and discussed the topic of stress and the related symptoms and triggers for stress. Patients identified healthy coping skills they would like to try during hospitalization and after discharge to manage stress in a healthy way. CSW offered insight to varying stress management techniques.   Malya Cirillo G. Garnette Czech MSW, LCSWA 08/13/2016, 10:20 AM

## 2016-08-14 DIAGNOSIS — F1420 Cocaine dependence, uncomplicated: Secondary | ICD-10-CM

## 2016-08-14 LAB — PREGNANCY, URINE: Preg Test, Ur: NEGATIVE

## 2016-08-14 LAB — HEMOGLOBIN A1C
HEMOGLOBIN A1C: 5.3 % (ref 4.8–5.6)
Mean Plasma Glucose: 105 mg/dL

## 2016-08-14 MED ORDER — FLUOXETINE HCL 20 MG PO CAPS
20.0000 mg | ORAL_CAPSULE | Freq: Every day | ORAL | Status: DC
  Administered 2016-08-15 – 2016-08-20 (×6): 20 mg via ORAL
  Filled 2016-08-14 (×6): qty 1

## 2016-08-14 NOTE — Plan of Care (Addendum)
Problem: Coping: Goal: Ability to demonstrate self-control will improve Outcome: Progressing Handout given on coping skills , encourage to work on her own

## 2016-08-14 NOTE — [facility] Group Notes (Signed)
[facility] LCSW Group Therapy  08/14/2016 2:30 PM  Type of Therapy:  Group Therapy  Participation Level:  Patient did not attend group. CSW invited patient to group.    Summary of Progress/Problems: Self esteem: Patients discussed self esteem and how it impacts them. They discussed what aspects in their lives has influenced their self esteem. They were challenged to identify changes that are needed in order to improve self esteem. Patients participated in activity where they had to identify positive adjectives they felt described their personality. Patients shared with the group on the following areas: Things I am good at, What I like about my appearance, I've helped others by, What I value the most, compliments I have received, challenges I have overcome, thing that make me unique, and Times I've made others happy.   Tracie Hodge MSW, LCSWA 08/14/2016, 2:31 PM

## 2016-08-14 NOTE — [facility] Group Notes (Signed)
[facility] Group Notes:  (Nursing/MHT/Case Management/Adjunct)  Date:  08/14/2016  Time:  12:51 AM  Type of Therapy:  Group Therapy  Participation Level:  Did Not Attend    Tracie Hodge 08/14/2016, 12:51 AM

## 2016-08-14 NOTE — Plan of Care (Signed)
Problem: Education: Goal: Verbalization of understanding the information provided will improve Outcome: Progressing Patient asked questions about medications ordered for muscle tension and was given information about Flexeril.
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
## 2016-08-14 NOTE — Progress Notes (Signed)
D: Patient able to get out of bed this am and walk down to dayroom for meals  During the shift . Affect sad and depressed .  Patient met with MD  This shift. No ADL's  At present . Patient stated slept fair last night Stated appetite is fair and energy level  low. Stated concentration is good . Stated on Depression scale 10 , hopeless 10 and anxiety 10 .( low 0-10 high) Denies suicidal  homicidal ideations  .  No auditory hallucinations  No pain concerns . Appropriate ADL'S. Interacting with peers and staff. Patient voice of having no support from family . Hopeful the the lady she was staying with   Saves her clothes . Patient  Also voice of her husbands car being picked up  By his uncle. Stated her husband  Was getting out  Of prison  In August .  A: Encourage patient participation with unit programming . Instruction  Given on  Medication , verbalize understanding. Encourage  Patient to think of being positive. R: Voice no other concerns. Staff continue to monitor
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
## 2016-08-14 NOTE — [facility] Suicide Risk Assessment (Signed)
[facility] INPATIENT:  Family/Significant Other Suicide Prevention Education  Suicide Prevention Education:  Patient Refusal for Family/Significant Other Suicide Prevention Education: The patient Tracie Hodge has refused to provide written consent for family/significant other to be provided Family/Significant Other Suicide Prevention Education during admission and/or prior to discharge.  Physician notified.  Mabrey Howland G. Garnette Czech MSW, LCSWA 08/14/2016, 11:50 AM

## 2016-08-14 NOTE — Progress Notes (Signed)
Patient was observed napping at initial shift rounds.  She was up for wrap up meeting and HS snack.  She was pleasant on contact but had a sad affect.  She was observed engaging with peers briefly.  She took medications and prepared for bed by 2130.

## 2016-08-15 MED ORDER — RISPERIDONE 1 MG PO TABS
2.0000 mg | ORAL_TABLET | Freq: Every day | ORAL | Status: DC
  Administered 2016-08-15: 2 mg via ORAL
  Filled 2016-08-15: qty 2

## 2016-08-15 MED ORDER — HYDROXYZINE HCL 50 MG PO TABS
50.0000 mg | ORAL_TABLET | Freq: Three times a day (TID) | ORAL | Status: DC | PRN
  Administered 2016-08-15: 50 mg via ORAL
  Filled 2016-08-15: qty 1

## 2016-08-15 NOTE — Progress Notes (Signed)
Pt continues to be isolative to room. Request ibuprofen and flexeril for pain. Request vistaril for anxiety. Pt tearful at times, but could not elaborate on what was wrong. Denies SI, HI, AVH. Is med compliant, does not attend group.  Encouragement and support offered. Safety checks maintained. Pt remains safe on unit.
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
## 2016-08-15 NOTE — [facility] Group Notes (Signed)
[facility] Group Notes:  (Nursing/MHT/Case Management/Adjunct)  Date:  08/15/2016  Time:  1:52 AM  Type of Therapy:  Psychoeducational Skills  Participation Level:  Did Not Attend  Participation Quality:Summary of Progress/Problems:  Tracie Hodge 08/15/2016, 1:52 AM

## 2016-08-15 NOTE — Plan of Care (Signed)
Problem: Coping: Goal: Ability to verbalize feelings will improve Outcome: Progressing Pt able to discuss stressors with Clinical research associate

## 2016-08-15 NOTE — [facility] Group Notes (Signed)
[facility] Group Notes:  (Nursing/MHT/Case Management/Adjunct)  Date:  08/15/2016  Time:  9:27 PM  Type of Therapy:  Evening Wrap-up Group  Participation Level:  Did Not Attend  Participation Quality:  N/A  Affect:  N/A  Cognitive:  N/A  Insight:  None  Engagement in Group:  Did Not Attend  Modes of Intervention:  Discussion  Summary of Progress/Problems:  Tomasita Morrow 08/15/2016, 9:27 PM

## 2016-08-15 NOTE — Progress Notes (Signed)
Patient has refused cpap for the night per staff

## 2016-08-15 NOTE — Progress Notes (Signed)
Patient was observed in a dark room in the bed at the beginning of the shift.  She requested all "bedtime medications".  She accepted information that HS medications could be given at 2100.  She rates her pain at 7/10.  She continues to isolate herself from the group.

## 2016-08-15 NOTE — [facility] Group Notes (Signed)
[facility] LCSW Group Therapy  08/15/2016 2:46 PM  Type of Therapy:  Group Therapy  Participation Level:  Minimal  Participation Quality:  Inattentive  Affect:  Anxious, Depressed and Tearful  Cognitive:  Appropriate  Insight:  None  Engagement in Therapy:  Limited  Modes of Intervention:  Activity, Clarification, Discussion, Education, Exploration, Limit-setting, Problem-solving, Rapport Building, Dance movement psychotherapist, Socialization and Support  Summary of Progress/Problems: Self-responsibility/accountability- Patients discussed self responsibility/accountability  and how it impacts them. Patients were asked to define these concepts in their own words. They discussed taking ownership of their actions and the challenges they have with taking accountability for their self. CSW discussed how to improve their communication with others. Examples were provided of each. They were challenged to identify changes that are needed in order to improve self responsibility/accountability. CSW provided inspirational quotes that focused on the patients taking accountability for actions both good and bad. Patient started crying at the being of group stating she felt overwhelmed and anxious. CSW provided support to patient and asked what triggered these emotions. Patient stated she was unsure but stated she felt restless. Patient left group shortly after and did not return.   Milena Liggett G. Garnette Czech MSW, LCSWA 08/15/2016, 2:49 PM

## 2016-08-15 NOTE — Plan of Care (Signed)
Problem: Coping: Goal: Ability to verbalize feelings will improve Outcome: Progressing Patient rates chronic pain at 7/10 and depression at 6/10.

## 2016-08-16 DIAGNOSIS — M51379 Other intervertebral disc degeneration, lumbosacral region without mention of lumbar back pain or lower extremity pain: Secondary | ICD-10-CM

## 2016-08-16 DIAGNOSIS — M5137 Other intervertebral disc degeneration, lumbosacral region: Secondary | ICD-10-CM

## 2016-08-16 MED ORDER — TRAZODONE HCL 100 MG PO TABS
100.0000 mg | ORAL_TABLET | Freq: Every day | ORAL | Status: DC
  Administered 2016-08-16 – 2016-08-19 (×4): 100 mg via ORAL
  Filled 2016-08-16 (×4): qty 1

## 2016-08-16 MED ORDER — RISPERIDONE 1 MG PO TABS
0.5000 mg | ORAL_TABLET | Freq: Three times a day (TID) | ORAL | Status: DC
  Administered 2016-08-16 – 2016-08-19 (×10): 0.5 mg via ORAL
  Filled 2016-08-16 (×10): qty 1

## 2016-08-16 NOTE — Plan of Care (Signed)
Problem: Activity: Goal: Interest or engagement in leisure activities will improve Outcome: Not Progressing Encouraged to remain on unit in milieu but prefers to stay in room this morning.
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
## 2016-08-16 NOTE — Tx Team (Addendum)
Interdisciplinary Treatment and Diagnostic Plan Update  08/16/2016 Time of Session: 1140 Tracie Hodge MRN: [id number]  Principal Diagnosis: Severe recurrent major depression without psychotic features [facility])  Secondary Diagnoses: Principal Problem:   Severe recurrent major depression without psychotic features (HCC) Active Problems:   PTSD (post-traumatic stress disorder)   GERD (gastroesophageal reflux disease)   Cocaine use disorder, severe, dependence (HCC)   OSA (obstructive sleep apnea)   Tobacco use disorder   Asthma   DDD (degenerative disc disease), lumbosacral   Current Medications:  Current Facility-Administered Medications  Medication Dose Route Frequency Provider Last Rate Last Dose  . acetaminophen (TYLENOL) tablet 1,000 mg  1,000 mg Oral Q6H PRN Jimmy Footman, MD      . albuterol (PROVENTIL HFA;VENTOLIN HFA) 108 (90 Base) MCG/ACT inhaler 1-2 puff  1-2 puff Inhalation Q4H PRN Jimmy Footman, MD      . alum & mag hydroxide-simeth (MAALOX/MYLANTA) 200-200-20 MG/5ML suspension 30 mL  30 mL Oral Q4H PRN Audery Amel, MD      . cyclobenzaprine (FLEXERIL) tablet 7.5 mg  7.5 mg Oral TID PRN Jimmy Footman, MD   7.5 mg at 08/16/16 0801  . FLUoxetine (PROZAC) capsule 20 mg  20 mg Oral Daily Darliss Ridgel, MD   20 mg at 08/16/16 0800  . hydrOXYzine (ATARAX/VISTARIL) tablet 50 mg  50 mg Oral TID PRN Darliss Ridgel, MD   50 mg at 08/15/16 1605  . ibuprofen (ADVIL,MOTRIN) tablet 600 mg  600 mg Oral Q6H PRN Jimmy Footman, MD   600 mg at 08/16/16 0801  . magnesium hydroxide (MILK OF MAGNESIA) suspension 30 mL  30 mL Oral Daily PRN Audery Amel, MD      . nicotine (NICODERM CQ - dosed in mg/24 hours) patch 21 mg  21 mg Transdermal Daily Jimmy Footman, MD   21 mg at 08/16/16 0800  . pantoprazole (PROTONIX) EC tablet 40 mg  40 mg Oral Daily Jimmy Footman, MD   40 mg at 08/16/16 0800  . prazosin (MINIPRESS) capsule  1 mg  1 mg Oral QHS Jimmy Footman, MD   1 mg at 08/15/16 2100  . risperiDONE (RISPERDAL) tablet 0.5 mg  0.5 mg Oral TID Jimmy Footman, MD   0.5 mg at 08/16/16 1211  . traZODone (DESYREL) tablet 100 mg  100 mg Oral QHS Jimmy Footman, MD       PTA Medications: Prescriptions Prior to Admission  Medication Sig Dispense Refill Last Dose  . [DISCONTINUED] albuterol (PROVENTIL HFA;VENTOLIN HFA) 108 (90 Base) MCG/ACT inhaler Inhale 2 puffs into the lungs every 6 (six) hours as needed for wheezing or shortness of breath. 1 Inhaler 2   . [DISCONTINUED] albuterol (PROVENTIL HFA;VENTOLIN HFA) 108 (90 Base) MCG/ACT inhaler Inhale 2 puffs into the lungs every 6 (six) hours as needed for wheezing or shortness of breath.   PRN at PRN  . [DISCONTINUED] buPROPion (WELLBUTRIN XL) 150 MG 24 hr tablet Take 150 mg by mouth daily. Take with 300 mg tablet for a 450 mg dose   Taking  . [DISCONTINUED] buPROPion (WELLBUTRIN XL) 300 MG 24 hr tablet Take 300 mg by mouth daily. Take with 150 mg tablet for a 450 mg dose   Taking  . [DISCONTINUED] cyclobenzaprine (FLEXERIL) 10 MG tablet Take 1 tablet (10 mg total) by mouth 3 (three) times daily as needed for muscle spasms. 30 tablet 0   . [DISCONTINUED] escitalopram (LEXAPRO) 10 MG tablet Take 1 tablet (10 mg total) by mouth daily. 30 tablet  0   . [DISCONTINUED] escitalopram (LEXAPRO) 10 MG tablet Take 10 mg by mouth daily.   UNKNOWN at UNKNOWN  . [DISCONTINUED] FLUoxetine (PROZAC) 20 MG capsule Take 1 capsule (20 mg total) by mouth daily. 30 capsule 1   . [DISCONTINUED] FLUoxetine (PROZAC) 20 MG capsule Take 20 mg by mouth daily.   UNKNOWN at UNKNOWN  . [DISCONTINUED] HYDROcodone-acetaminophen (NORCO/VICODIN) 5-325 MG tablet Take 1 tablet by mouth every 6 (six) hours as needed for moderate pain. 12 tablet 0   . [DISCONTINUED] ibuprofen (ADVIL,MOTRIN) 600 MG tablet Take 1 tablet (600 mg total) by mouth every 8 (eight) hours as needed. 30 tablet  0   . [DISCONTINUED] naproxen (NAPROSYN) 500 MG tablet Take 1 tablet (500 mg total) by mouth 2 (two) times daily with a meal. 20 tablet 00   . [DISCONTINUED] ondansetron (ZOFRAN) 8 MG tablet Take 1 tablet (8 mg total) by mouth every 8 (eight) hours as needed for nausea. 20 tablet 0 Not Taking  . [DISCONTINUED] oxyCODONE-acetaminophen (PERCOCET) 7.5-325 MG tablet Take 1 tablet by mouth every 4 (four) hours as needed for severe pain. 20 tablet 0   . [DISCONTINUED] predniSONE (STERAPRED UNI-PAK 21 TAB) 10 MG (21) TBPK tablet Dispense steroid taper pack as directed 21 tablet 0   . [DISCONTINUED] promethazine (PHENERGAN) 25 MG tablet Take 25 mg by mouth every 6 (six) hours as needed for nausea.   Not Taking  . [DISCONTINUED] ranitidine (ZANTAC) 150 MG tablet Take 150 mg by mouth at bedtime.   Taking  . [DISCONTINUED] ranitidine (ZANTAC) 150 MG tablet Take 150 mg by mouth at bedtime.   UNKNOWN at UNKNOWN  . [DISCONTINUED] traZODone (DESYREL) 100 MG tablet Take 1 tablet (100 mg total) by mouth at bedtime. 30 tablet 1   . [DISCONTINUED] traZODone (DESYREL) 100 MG tablet Take 100 mg by mouth at bedtime.   UNKNOWN at UNKNOWN  . [DISCONTINUED] trazodone (DESYREL) 300 MG tablet Take 300 mg by mouth at bedtime.   Taking    Patient Stressors: Financial difficulties Marital or family conflict Substance abuse  Patient Strengths: Geographical information systems officer for treatment/growth  Treatment Modalities: Medication Management, Group therapy, Case management,  1 to 1 session with clinician, Psychoeducation, Recreational therapy.   Physician Treatment Plan for Primary Diagnosis: Severe recurrent major depression without psychotic features (HCC) Long Term Goal(s): Improvement in symptoms so as ready for discharge Improvement in symptoms so as ready for discharge   Short Term Goals: Ability to identify changes in lifestyle to reduce recurrence of condition will improve Ability to demonstrate  self-control will improve Ability to identify triggers associated with substance abuse/mental health issues will improve Ability to identify and develop effective coping behaviors will improve  Medication Management: Evaluate patient's response, side effects, and tolerance of medication regimen.  Therapeutic Interventions: 1 to 1 sessions, Unit Group sessions and Medication administration.  Evaluation of Outcomes: Progressing  Physician Treatment Plan for Secondary Diagnosis: Principal Problem:   Severe recurrent major depression without psychotic features (HCC) Active Problems:   PTSD (post-traumatic stress disorder)   GERD (gastroesophageal reflux disease)   Cocaine use disorder, severe, dependence (HCC)   OSA (obstructive sleep apnea)   Tobacco use disorder   Asthma   DDD (degenerative disc disease), lumbosacral  Long Term Goal(s): Improvement in symptoms so as ready for discharge Improvement in symptoms so as ready for discharge   Short Term Goals: Ability to identify changes in lifestyle to reduce recurrence of condition will improve Ability to demonstrate  self-control will improve Ability to identify triggers associated with substance abuse/mental health issues will improve Ability to identify and develop effective coping behaviors will improve     Medication Management: Evaluate patient's response, side effects, and tolerance of medication regimen.  Therapeutic Interventions: 1 to 1 sessions, Unit Group sessions and Medication administration.  Evaluation of Outcomes: Progressing   RN Treatment Plan for Primary Diagnosis: Severe recurrent major depression without psychotic features (HCC) Long Term Goal(s): Knowledge of disease and therapeutic regimen to maintain health will improve  Short Term Goals: Ability to remain free from injury will improve, Ability to disclose and discuss suicidal ideas, Ability to identify and develop effective coping behaviors will improve and  Compliance with prescribed medications will improve  Medication Management: RN will administer medications as ordered by provider, will assess and evaluate patient's response and provide education to patient for prescribed medication. RN will report any adverse and/or side effects to prescribing provider.  Therapeutic Interventions: 1 on 1 counseling sessions, Psychoeducation, Medication administration, Evaluate responses to treatment, Monitor vital signs and CBGs as ordered, Perform/monitor CIWA, COWS, AIMS and Fall Risk screenings as ordered, Perform wound care treatments as ordered.  Evaluation of Outcomes: Progressing   LCSW Treatment Plan for Primary Diagnosis: Severe recurrent major depression without psychotic features (HCC) Long Term Goal(s): Safe transition to appropriate next level of care at discharge, Engage patient in therapeutic group addressing interpersonal concerns.  Short Term Goals: Engage patient in aftercare planning with referrals and resources and Increase social support  Therapeutic Interventions: Assess for all discharge needs, 1 to 1 time with Social worker, Explore available resources and support systems, Assess for adequacy in community support network, Educate family and significant other(s) on suicide prevention, Complete Psychosocial Assessment, Interpersonal group therapy.  Evaluation of Outcomes: Progressing    Recreational Therapy Treatment Plan for Primary Diagnosis: Severe recurrent major depression without psychotic features (HCC) Long Term Goal(s): Patient will participate in recreation therapy treatment in at least 2 group sessions without prompting from LRT  Short Term Goals: Increase self-esteem, Increase healthy coping skills  Treatment Modalities: Group Therapy and Individual Treatment Sessions  Therapeutic Interventions: Psychoeducation  Evaluation of Outcomes: Progressing   Progress in Treatment: Attending groups: No. Participating in  groups: No. Taking medication as prescribed: Yes. Toleration medication: Yes. Family/Significant other contact made: No.  Pt refused to consent. Patient understands diagnosis: Yes. Discussing patient identified problems/goals with staff: No. Medical problems stabilized or resolved: Yes. Denies suicidal/homicidal ideation: Yes. Issues/concerns per patient self-inventory: No. Other: none  New problem(s) identified: No, Describe:  none  New Short Term/Long Term Goal(s): Pt goal: to get myself mentally stable, to believe in myself more.  Discharge Plan or Barriers: Pt expressing interest in residential substance abuse treatment.  Reason for Continuation of Hospitalization: Depression Medication stabilization  Estimated Length of Stay: 2-3 days.   Attendees: Patient: 08/16/2016   Physician: Dr. Ardyth Harps, MD 08/16/2016   Nursing: Hulan Amato, RN 08/16/2016   RN Care Manager: 08/16/2016   Social Worker: Daleen Squibb, LCSW 08/16/2016   Recreational Therapist: Princella Ion, LRT/CTRS  08/16/2016   Other:  08/16/2016   Other:  08/16/2016   Other: 08/16/2016          Scribe for Treatment Team: Lorri Frederick, LCSW 08/16/2016 2:11 PM

## 2016-08-16 NOTE — Progress Notes (Signed)
AAOx4 on unit, calm and compliant with treatment. Continues to endorse depression but denies SI.HI.AVH. Visible on unit and interacting appropriately with staff an peers. Able to make needs know. Will continue to monitor.

## 2016-08-16 NOTE — Progress Notes (Signed)
Recreation Therapy Notes  Date: 04.16.18 Time: 9:30 am Location: Craft Room  Group Topic: Self-expression  Goal Area(s) Addresses:  Patient will be able to identify a color that represents each emotion. Patient will verbalize benefit of using art as a means of self-expression. Patient will verbalize one emotion experienced while participating in group.  Behavioral Response: Did not attend  Intervention: The Colors Within Me  Activity: Patients were given a blank face worksheet and were instructed to pick a color for each emotion they were feeling and show on the worksheet how much of that emotion they were feeling.  Education: LRT educated patients on other forms of self-expression.   Education Outcome: Patient did not attend group.   Clinical Observations/Feedback: Patient did not attend group.  Jacquelynn Cree, LRT/CTRS 08/16/2016 10:11 AM

## 2016-08-16 NOTE — [facility] Group Notes (Signed)
[facility] LCSW Group Therapy  08/16/2016 1:15 pm  Type of Therapy: Process Group Therapy  Participation Level:  Active  Participation Quality:  Appropriate  Affect:  Flat  Cognitive:  Oriented  Insight:  Improving  Engagement in Group:  Limited  Engagement in Therapy:  Limited  Modes of Intervention:  Activity, Clarification, Education, Problem-solving and Support  Summary of Progress/Problems: Today's group addressed the issue of overcoming obstacles.  Patients were asked to identify their biggest obstacle post d/c that stands in the way of their on-going success, and then problem solve as to how to manage this. Stayed for the entire time, engaged throughout. Identified her biggest obstacle as her incarcerated husband who had her car towed away so she currently does not have any transportation.  "He gets out in August, and I'll be waiting there for him.  People keep telling me I should leave him, but I'm not sure what to do."  Of course, several of her peers told her she needed to leave, and she told them all why she was unable to.  Tracie Hodge 08/16/2016   3:49 PM

## 2016-08-17 MED ORDER — IBUPROFEN 600 MG PO TABS: 600.0000 mg | ORAL_TABLET | Freq: Four times a day (QID) | ORAL | 0 refills | Status: DC | PRN

## 2016-08-17 MED ORDER — PANTOPRAZOLE SODIUM 40 MG PO TBEC: 40.0000 mg | DELAYED_RELEASE_TABLET | Freq: Every day | ORAL | 0 refills | Status: DC

## 2016-08-17 MED ORDER — CYCLOBENZAPRINE HCL 5 MG PO TABS: 5.0000 mg | ORAL_TABLET | Freq: Three times a day (TID) | ORAL | 0 refills | Status: DC | PRN

## 2016-08-17 MED ORDER — PRAZOSIN HCL 1 MG PO CAPS: 1.0000 mg | ORAL_CAPSULE | Freq: Every day | ORAL | 0 refills | Status: DC

## 2016-08-17 MED ORDER — TRAZODONE HCL 100 MG PO TABS: 100.0000 mg | ORAL_TABLET | Freq: Every day | ORAL | 0 refills | Status: DC

## 2016-08-17 MED ORDER — ALBUTEROL SULFATE HFA 108 (90 BASE) MCG/ACT IN AERS: 1.0000 | INHALATION_SPRAY | RESPIRATORY_TRACT | 0 refills | Status: DC | PRN

## 2016-08-17 MED ORDER — FLUOXETINE HCL 20 MG PO CAPS
20.0000 mg | ORAL_CAPSULE | Freq: Every day | ORAL | 0 refills | Status: DC
Start: 2016-08-18 — End: 2017-10-16

## 2016-08-17 NOTE — Progress Notes (Signed)
D: Pt denies SI/HI/AVH. Pt is pleasant and cooperative. Pt stated she was happy about going to another place when she leaves here, pt said she was improving dramatically. Pt said "I need structure, so the place I'm going to will be good"  A: Pt was offered support and encouragement. Pt was given scheduled medications. Pt was encourage to attend groups. Q 15 minute checks were done for safety.   R:Pt attends groups and interacts well with peers and staff. Pt is taking medication. Pt has no complaints.Pt receptive to treatment and safety maintained on unit.

## 2016-08-17 NOTE — [facility] Group Notes (Signed)
[facility] LCSW Group Therapy Note  Date/Time: 08/17/16, 0930  Type of Therapy/Topic:  Group Therapy:  Feelings about Diagnosis  Participation Level:  Active   Mood: Pleasant   Description of Group:    This group will allow patients to explore their thoughts and feelings about diagnoses they have received. Patients will be guided to explore their level of understanding and acceptance of these diagnoses. Facilitator will encourage patients to process their thoughts and feelings about the reactions of others to their diagnosis, and will guide patients in identifying ways to discuss their diagnosis with significant others in their lives. This group will be process-oriented, with patients participating in exploration of their own experiences as well as giving and receiving support and challenge from other group members.   Therapeutic Goals: 1. Patient will demonstrate understanding of diagnosis as evidence by identifying two or more symptoms of the disorder:  2. Patient will be able to express two feelings regarding the diagnosis 3. Patient will demonstrate ability to communicate their needs through discussion and/or role plays  Summary of Patient Progress:Pt was able to identify two symptoms of depression: anxiety, fear.  She also was an active participant in the discussion regarding stigma of mental health disorders and the difficulty of sharing about them with other.s        Therapeutic Modalities:   Cognitive Behavioral Therapy Brief Therapy Feelings Identification   Daleen Squibb, LCSW

## 2016-08-17 NOTE — Progress Notes (Signed)
Patient ID: Tracie Hodge, female   DOB: [birth year], 41 y.o.   MRN: [id number] A&Ox3, VSS, pleasant on approach, appropriate, mood and affect bright, attended the wrap-up group, denied SI/HI, denied AV/H, complied with medications.

## 2016-08-17 NOTE — Plan of Care (Signed)
Problem: [facility] Participation in Recreation Therapeutic Interventions Goal: STG-Patient will demonstrate improved self esteem by identif STG: Self-Esteem - Within 4 treatment sessions, patient will verbalize at least 5 positive affirmation statements in each of 2 treatment sessions to increase self-esteem.  Outcome: Progressing Treatment Session 1; Completed 1 out of 2: At approximately 2:20 pm, LRT met with patient in consultation room. Patient verbalized 5 positive affirmation statements. Patient reported it felt "kind of hard". LRT encouraged patient to continue saying positive affirmation statements.  Leonette Monarch, LRT/CTRS 04.17.18 4:12 pm Goal: STG-Patient will identify at least five coping skills for ** STG: Coping Skills - Within 4 treatment sessions, patient will verbalize at least 5 coping skills for substance abuse in each of 2 treatment sessions to decrease substance abuse.  Outcome: Progressing Treatment Session 1; Completed 1 out of 2: At approximately 2:20 pm, LRT met with patient in consultation room. Patient verbalized 5 coping skills for substance abuse. LRT educated patient on leisure and why it is important to implement it into her schedule. LRT educated and provided patient with blank schedules. LRT educated patient on healthy support systems.  Leonette Monarch, LRT/CTRS 04.17.18 4:14 pm

## 2016-08-17 NOTE — Plan of Care (Signed)
Problem: Safety: Goal: Periods of time without injury will increase Outcome: Progressing Pt safe on the unit on this time

## 2016-08-17 NOTE — Progress Notes (Signed)
Pt filled out application for [facility]: Heather at [employer] will review application and call back tomorrow. Pt had phone call with [name] at [facility].  [name] reports pt is appropriate for the program but she would need required paperwork and pt would not be able to move in until Friday. Garner Nash, MSW, LCSW Clinical Social Worker 08/17/2016 12:52 PM

## 2016-08-17 NOTE — [facility] Group Notes (Signed)
[facility] Group Notes:  (Nursing/MHT/Case Management/Adjunct)  Date:  08/17/2016  Time:  4:21 AM  Type of Therapy:  Group Therapy  Participation Level:  Active  Participation Quality:  Appropriate  Affect:  Appropriate  Cognitive:  Appropriate  Insight:  Appropriate  Engagement in Group:  Engaged  Modes of Intervention:  n/a  Summary of Progress/Problems:  [name] 08/17/2016, 4:21 AM

## 2016-08-17 NOTE — Progress Notes (Signed)
D: Patient stated slept fair last night .Stated appetite is good and energy level low. Stated concentration is good . Stated on Depression scale  6,  Hopeless 5 and anxiety 5 .( low 0-10 high) Denies suicidal  homicidal ideations  .  No auditory hallucinations  No pain concerns . Appropriate ADL'S. Interacting with peers and staff.  Writer talked with patient on coping skills  ,diagnosis and discharge planning .  A: Encourage patient participation with unit programming . Instruction  Given on  Medication , verbalize understanding. R: Voice no other concerns. Staff continue to monitor

## 2016-08-17 NOTE — Plan of Care (Signed)
Problem: Coping: Goal: Ability to verbalize frustrations and anger appropriately will improve Handout given on coping skills
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
## 2016-08-17 NOTE — Plan of Care (Signed)
Problem: Activity: Goal: Sleeping patterns will improve Outcome: Progressing Patient slept for Estimated Hours of 8; every 15 minutes safety round maintained, no injury or falls during this shift.    

## 2016-08-17 NOTE — Progress Notes (Signed)
Recreation Therapy Notes  Date: 04.17.18 Time: 3"00 pm Location: Craft Room  Group Topic: Communication  Goal Area(s) Addresses:  Patient will use effective communication skills. Patient will verbalize importance of using communication skills post d/c.  Behavioral Response: Attentive, Interactive  Intervention: Blind Drawing  Activity: Patients were put in pairs back-to-back. One patient was given a picture and was instructed to give the other patient directions on how to draw the picture without saying what the picture was. After both patients had a change at giving directions, patients were able to do a second round when they were side-by-side instead of back-to-back.  Education: LRT educated patients on Manufacturing systems engineer.  Education Outcome: Acknowledges education/In group clarification offered  Clinical Observations/Feedback: Patient gave instructed and listed to instructions from peer. Patient contributed to group discussion by stating why communication is important.  Jacquelynn Cree, LRT/CTRS 08/17/2016 3:45 PM

## 2016-08-17 NOTE — [facility] Group Notes (Signed)
[facility] Group Notes:  (Nursing/MHT/Case Management/Adjunct)  Date:  08/17/2016  Time:  10:56 PM  Type of Therapy:  Psychoeducational Skills  Participation Level:  Active  Participation Quality:  Appropriate and Sharing  Affect:  Appropriate and Excited  Cognitive:  Appropriate and Oriented  Insight:  Good  Engagement in Group:  Engaged  Modes of Intervention:  Discussion and Exploration  Summary of Progress/Problems:  Foy Guadalajara 08/17/2016, 10:56 PM

## 2016-08-17 NOTE — [facility] Group Notes (Signed)
[facility] Group Notes:  (Nursing/MHT/Case Management/Adjunct)  Date:  08/17/2016  Time:  6:04 PM  Type of Therapy:  Psychoeducational Skills  Participation Level:  Active  Participation Quality:  Appropriate, Attentive and Supportive  Affect:  Appropriate  Cognitive:  Appropriate  Insight:  Appropriate  Engagement in Group:  Engaged and Supportive  Modes of Intervention:  Discussion and Education  Summary of Progress/Problems:  Ellyse Rotolo C Omarian Jaquith 08/17/2016, 6:04 PM 

## 2016-08-18 NOTE — Progress Notes (Signed)
Recreation Therapy Notes  Date: 04.18.18 Time: 1:00 pm Location: Craft Room  Group Topic: Self-esteem  Goal Area(s) Addresses:  Patient will be able to identify benefit of having a healthy self-esteem. Patient will be able to identify ways to increase self-esteem.  Behavioral Response: Attentive, Interactive  Intervention: Self-Portrait  Activity: Patients were given a blank face worksheet and were instructed to draw their self-portrait of how they were feeling. Patients were given paper and were instructed to write a positive trait about themselves and their peers. Patients were given another blank face worksheet and were instructed to draw their self-portrait based on how they felt after reading positive comments from peers.  Education: LRT educated patients on ways they can increase their self-esteem.  Education Outcome: Acknowledges education/In group clarification offered  Clinical Observations/Feedback: Patient drew both self-portraits and wrote a positive trait about self and peers. Patient contributed to group discussion by stating how her self-esteem affects her, and how she can increase her self-esteem.  Jacquelynn Cree, LRT/CTRS 08/18/2016 1:58 PM

## 2016-08-18 NOTE — [facility] Group Notes (Signed)
[facility] LCSW Group Therapy 08/18/2016 9:30 AM  Type of Therapy: Group Therapy- Emotion Regulation  Participation Level: Active   Participation Quality:  Appropriate  Affect: Appropriate  Cognitive: Alert and Oriented   Insight:  Developing/Improving  Engagement in Therapy: Developing/Improving and Engaged   Modes of Intervention: Clarification, Confrontation, Discussion, Education, Exploration, Limit-setting, Orientation, Problem-solving, Rapport Building, Dance movement psychotherapist, Socialization and Support  Summary of Progress/Problems: The topic for group today was emotional regulation. This group focused on both positive and negative emotion identification and allowed group members to process ways to identify feelings, regulate negative emotions, and find healthy ways to manage internal/external emotions. Group members were asked to reflect on a time when their reaction to an emotion led to a negative outcome and explored how alternative responses using emotion regulation would have benefited them. Group members were also asked to discuss a time when emotion regulation was utilized when a negative emotion was experienced. Pt identified anxiety as an emotion that is difficult for her to regulate. Pt states that her husband is a trigger because he can be controlling. Pt also mentioned that her husband is currently incarcerated but still being controlling. Pt plans to decrease the amount of contact that she has with him while he is in jail so that she can focus on her recovery.   Jonathon Jordan, MSW, LCSWA 08/18/2016 10:22 AM
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
## 2016-08-18 NOTE — Plan of Care (Signed)
Problem: [facility] Participation in Recreation Therapeutic Interventions Goal: STG-Patient will demonstrate improved self esteem by identif STG: Self-Esteem - Within 4 treatment sessions, patient will verbalize at least 5 positive affirmation statements in each of 2 treatment sessions to increase self-esteem.  Outcome: Completed/Met Date Met: 08/18/16 Treatment Session 2; Completed 2 out of 2: At approximately 10:35 am, LRT met with patient in craft room. Patient verbalized 5 positive affirmation statements. Patient reported it felt "better that time". LRT encouraged patient to continue saying positive affirmation statements.  Leonette Monarch, LRT/CTRS 04.18.18 11:50 am Goal: STG-Patient will identify at least five coping skills for ** STG: Coping Skills - Within 4 treatment sessions, patient will verbalize at least 5 coping skills for substance abuse in each of 2 treatment sessions to decrease substance abuse.  Outcome: Completed/Met Date Met: 08/18/16 Treatment Session 2; Completed 2 out of 2: At approximately 10:35 am, LRT met with patient in craft room. Patient verbalized 5 coping skills for substance abuse. LRT encouraged patient to use her coping skills instead of turning to substances.  Leonette Monarch, LRT/CTRS 04.18.18 11:51 am

## 2016-08-18 NOTE — Progress Notes (Signed)
TC Tracie Hodge, [employer].  [name] has received all paperwork and pt is accepted to the program.  Date of admission can be Friday, 4/20.  They cannot move that date up.  Please attempt to have pt arrive at [facility] as admission process is long. Garner Nash, MSW, LCSW Clinical Social Worker 08/18/2016 1:33 PM

## 2016-08-19 MED ORDER — LORATADINE 10 MG PO TABS
10.0000 mg | ORAL_TABLET | Freq: Every day | ORAL | Status: DC
  Administered 2016-08-19 – 2016-08-20 (×2): 10 mg via ORAL
  Filled 2016-08-19 (×2): qty 1

## 2016-08-19 MED ORDER — OXYMETAZOLINE HCL 0.05 % NA SOLN
1.0000 | Freq: Two times a day (BID) | NASAL | Status: DC
  Filled 2016-08-19: qty 15

## 2016-08-19 MED ORDER — PSEUDOEPHEDRINE HCL ER 120 MG PO TB12
120.0000 mg | ORAL_TABLET | Freq: Two times a day (BID) | ORAL | Status: DC
  Administered 2016-08-19 – 2016-08-20 (×2): 120 mg via ORAL
  Filled 2016-08-19 (×2): qty 1

## 2016-08-19 MED ORDER — RISPERIDONE 0.25 MG PO TABS
0.2500 mg | ORAL_TABLET | Freq: Three times a day (TID) | ORAL | Status: DC
  Administered 2016-08-19 – 2016-08-20 (×2): 0.25 mg via ORAL
  Filled 2016-08-19 (×3): qty 1
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
## 2016-08-19 NOTE — Progress Notes (Signed)
Recreation Therapy Notes  Date: 04.19.18 Time: 1:00 pm Location: Craft Room  Group Topic: Leisure Education  Goal Area(s) Addresses:  Patient will identify activity for each letter of the alphabet. Patient will verbalize ability to integrate positive leisure into life post d/c. Patient will verbalize ability to use leisure as a Associate Professor.  Behavioral Response: Did not attend  Intervention: Leisure Alphabet  Activity: Patients were given a Leisure Information systems manager and were instructed to write healthy leisure activities for each letter of the alphabet.  Education: LRT educated patients on what they need to participate in leisure.  Education Outcome: Patient did not attend group.   Clinical Observations/Feedback: Patient did not attend group.  Jacquelynn Cree, LRT/CTRS 08/19/2016 1:56 PM

## 2016-08-19 NOTE — [facility] Group Notes (Signed)
[facility] LCSW Group Therapy Note  Date/Time: 08/19/16, 0930  Type of Therapy/Topic:  Group Therapy:  Balance in Life  Participation Level:  active  Description of Group:    This group will address the concept of balance and how it feels and looks when one is unbalanced. Patients will be encouraged to process areas in their lives that are out of balance, and identify reasons for remaining unbalanced. Facilitators will guide patients utilizing problem- solving interventions to address and correct the stressor making their life unbalanced. Understanding and applying boundaries will be explored and addressed for obtaining  and maintaining a balanced life. Patients will be encouraged to explore ways to assertively make their unbalanced needs known to significant others in their lives, using other group members and facilitator for support and feedback.  Therapeutic Goals: 1. Patient will identify two or more emotions or situations they have that consume much of in their lives. 2. Patient will identify signs/triggers that life has become out of balance:  3. Patient will identify two ways to set boundaries in order to achieve balance in their lives:  4. Patient will demonstrate ability to communicate their needs through discussion and/or role plays  Summary of Patient Progress: Pt identified family, personal relationships, spiritual, and financial as areas of her life that are out of balance.  Pt was active participant in discussion regarding how to identify problem areas and how to set boundaries to restore balance.          Therapeutic Modalities:   Cognitive Behavioral Therapy Solution-Focused Therapy Assertiveness Training  Daleen Squibb, [place]

## 2016-08-19 NOTE — Progress Notes (Signed)
Pt with complaints of sinus congestion/drainage this morning--medications given as ordered. Denies SI/HI/AVH, pain. Reports good sleep last night, good appetite, low energy, good concentration. Rates depression 4/10, hopelessness 3/10, anxiety 3/10 (low 0-10 high). Goal today is "coping skills" by "attend group." Pt is pleasant and cooperative. Voices worry about how she will transport to [facility] house once discharged tomorrow because her sister-in-law is supposed to pick her up, but pt has not been able to get in touch with her today. Sister in law did visit last night. Attends group. Appropriately interacts with staff/peers. Pt is medication compliant. Support and encouragement provided. Medications administered as ordered with education. Safety maintained with every 15 minute checks. Will continue to monitor.
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
## 2016-08-19 NOTE — Progress Notes (Signed)
Patient ID: Tracie Hodge, female   DOB: Nov 05, 1975, 41 y.o.   MRN: [id number] Bright mood and affect, visible, interacting well with peers; A&Ox3, Tylenol 1000 mg given for c/o lower back pain (DDD), denied SI/HI/AVH.
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
## 2016-08-19 NOTE — Plan of Care (Signed)
Problem: Activity: Goal: Sleeping patterns will improve Outcome: Progressing Patient slept for Estimated Hours of 6.45; every 15 minutes safety round maintained, no injury or falls during this shift.    

## 2016-08-19 NOTE — Plan of Care (Signed)
Problem: Physical Regulation: Goal: Complications related to the disease process, condition or treatment will be avoided or minimized Outcome: Progressing No withdrawal signs/symptoms reported or observed. Denies SI/HI/AVH.

## 2016-08-20 NOTE — Plan of Care (Signed)
Problem: Activity: Goal: Sleeping patterns will improve Outcome: Progressing Patient slept for Estimated Hours of 8.15; every 15 minutes safety round maintained, no injury or falls during this shift.    

## 2016-08-20 NOTE — Progress Notes (Signed)
Patient ID: Tracie Hodge, female   DOB: [birth year], 41 y.o.   MRN: [id number] Bright mood and affect, self content "I am going home tomorrow ..." excited, anticipating discharge, no behavioral problems, appropriate. Denied SI/HI/AVH, medication compliant.

## 2016-08-20 NOTE — Progress Notes (Signed)
Recreation Therapy Notes  INPATIENT RECREATION TR PLAN  Patient Details Name: AMARA JUSTEN MRN: [id number] DOB: Jun 28, 1975 Today's Date: 08/20/2016  Rec Therapy Plan Is patient appropriate for Therapeutic Recreation?: Yes Treatment times per week: At least once a week TR Treatment/Interventions: 1:1 session, Group participation (Comment) (Appropriate participation in daily recreational therapy tx)  Discharge Criteria Pt will be discharged from therapy if:: Treatment goals are met, Discharged Treatment plan/goals/alternatives discussed and agreed upon by:: Patient/family  Discharge Summary Short term goals set: See Care Plan Short term goals met: Complete Progress toward goals comments: One-to-one attended Which groups?: Communication, Self-esteem One-to-one attended: Self-esteem, coping skills Reason goals not met: N/A Therapeutic equipment acquired: None Reason patient discharged from therapy: Discharge from hospital Pt/family agrees with progress & goals achieved: Yes Date patient discharged from therapy: 08/20/16   Leonette Monarch, LRT/CTRS 08/20/2016, 2:39 PM

## 2016-08-20 NOTE — BHH Group Notes (Signed)
BHH Group Notes:  (Nursing/MHT/Case Management/Adjunct)  Date:  08/20/2016  Time:  5:34 AM  Type of Therapy:  Psychoeducational Skills  Participation Level:  Active  Participation Quality:  Appropriate, Attentive and Sharing  Affect:  Appropriate  Cognitive:  Appropriate  Insight:  Appropriate and Good  Engagement in Group:  Engaged  Modes of Intervention:  Discussion, Socialization and Support  Summary of Progress/Problems:  Tracie Hodge 08/20/2016, 5:34 AM

## 2016-08-20 NOTE — Progress Notes (Signed)
  [facility] Adult Case Management Discharge Plan :  Will you be returning to the same living situation after discharge:  No. At discharge, do you have transportation home?: Yes,  father Do you have the ability to pay for your medications: No. Medication Management Clinic referral made.  Release of information consent forms completed and in the chart;  Patient's signature needed at discharge.  Patient to Follow up at: Follow-up Information    [facility]. Go on 08/20/2016.   Why:  Please meet Hollace Hayward at [facility] before 12 noon today for admission to their residential substance abuse program. Contact information: [address] [place] [address] [phone]           Next level of care provider has access to [facility] Link:no  Safety Planning and Suicide Prevention discussed: No. Pt declined.  Have you used any form of tobacco in the last 30 days? (Cigarettes, Smokeless Tobacco, Cigars, and/or Pipes): Yes  Has patient been referred to the Quitline?: Patient refused referral  Patient has been referred for addiction treatment: Yes  Lorri Frederick, LCSW 08/20/2016, 8:32 AM

## 2016-08-20 NOTE — Progress Notes (Signed)
Patient discharged home. DC instructions provided and explained. Medications reviewed. 7 day supply given to patient. AVS, transition and risk assessment given. All questions answered. Denies SI, HI, AVH. Stored meds and belongings returned. Pt stable at discharge.
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
## 2016-09-01 ENCOUNTER — Ambulatory Visit (HOSPITAL_COMMUNITY)
Admission: RE | Admit: 2016-09-01 | Discharge: 2016-09-01 | Disposition: A | Discharge status: Home / Self Care | Payer: Self-pay | Source: Ambulatory Visit | Attending: Physician Assistant | Admitting: Physician Assistant

## 2016-09-01 ENCOUNTER — Encounter: Payer: Self-pay | Admitting: Physician Assistant

## 2016-09-01 ENCOUNTER — Ambulatory Visit: Payer: Self-pay | Admitting: Physician Assistant

## 2016-09-01 VITALS — BP 126/76 | HR 66 | Temp 98.1°F | Ht 62.5 in | Wt 256.2 lb

## 2016-09-01 DIAGNOSIS — F1911 Other psychoactive substance abuse, in remission: Secondary | ICD-10-CM

## 2016-09-01 DIAGNOSIS — F17219 Nicotine dependence, cigarettes, with unspecified nicotine-induced disorders: Secondary | ICD-10-CM

## 2016-09-01 DIAGNOSIS — M19071 Primary osteoarthritis, right ankle and foot: Secondary | ICD-10-CM | POA: Insufficient documentation

## 2016-09-01 DIAGNOSIS — G4733 Obstructive sleep apnea (adult) (pediatric): Secondary | ICD-10-CM

## 2016-09-01 DIAGNOSIS — M79671 Pain in right foot: Secondary | ICD-10-CM

## 2016-09-01 DIAGNOSIS — E876 Hypokalemia: Secondary | ICD-10-CM

## 2016-09-01 DIAGNOSIS — J449 Chronic obstructive pulmonary disease, unspecified: Secondary | ICD-10-CM | POA: Insufficient documentation

## 2016-09-01 MED ORDER — DICLOFENAC SODIUM 75 MG PO TBEC: 75.0000 mg | DELAYED_RELEASE_TABLET | Freq: Two times a day (BID) | ORAL | 0 refills | Status: DC

## 2016-09-01 NOTE — Patient Instructions (Addendum)
Do not take ibuprofen while taking the diclofenac  Ice the foot 10-15 minutes 4-5 times daily for next week.  Elevate the foot.  Avoid walking barefoot.  Avoid prolonged walking for one week.   Get xray at [facility] (radiology department)  Get blood/labs drawn at [facility] (lab)

## 2016-09-01 NOTE — Progress Notes (Signed)
BP 126/76 (BP Location: Left Arm, Patient Position: Sitting, Cuff Size: Large)   Pulse 66   Temp 98.1 F (36.7 C)   Ht 5' 2.5" (1.588 m)   Wt 256 lb 4 oz (116.2 kg)   LMP  (LMP Unknown)   SpO2 96%   BMI 46.12 kg/m    Subjective:    Patient ID: Tracie Hodge, female    DOB: [birth year], 41 y.o.   MRN: [id number]  HPI: Tracie Hodge is a 41 y.o. female presenting on 09/01/2016 for New Patient (Initial Visit) ([facility] patient. pt states last PCP was at [facility] in [place]. pt states it has been several years since last seen there.) and Foot Pain (R foot for about a week. pt states it swells up during the day. pt thinks it may be fractured. pt states she does not recall injuring it. pt states she take IBU for pain but does not help)   HPI  Chief Complaint  Patient presents with  . New Patient (Initial Visit)    [facility] patient. pt states last PCP was at [facility] in [place]. pt states it has been several years since last seen there.  . Foot Pain    R foot for about a week. pt states it swells up during the day. pt thinks it may be fractured. pt states she does not recall injuring it. pt states she take IBU for pain but does not help    Pt living at [facility] house now for substance dependence.  She is going to [facility].  Pt doesn't remember any injury to the foot.  It jus started hurting last Wednesday and swelling.  States pain steady.   Relevant past medical, surgical, family and social history reviewed and updated as indicated. Interim medical history since our last visit reviewed. Allergies and medications reviewed and updated.   Current Outpatient Prescriptions:  .  albuterol (PROVENTIL HFA;VENTOLIN HFA) 108 (90 Base) MCG/ACT inhaler, Inhale 1-2 puffs into the lungs every 4 (four) hours as needed for wheezing or shortness of breath., Disp: 1 Inhaler, Rfl: 0 .  FLUoxetine (PROZAC) 20 MG capsule, Take 1 capsule (20 mg total) by mouth daily., Disp: 30  capsule, Rfl: 0 .  ibuprofen (ADVIL,MOTRIN) 600 MG tablet, Take 1 tablet (600 mg total) by mouth every 6 (six) hours as needed for moderate pain., Disp: 30 tablet, Rfl: 0 .  pantoprazole (PROTONIX) 40 MG tablet, Take 1 tablet (40 mg total) by mouth daily., Disp: 30 tablet, Rfl: 0 .  traZODone (DESYREL) 100 MG tablet, Take 1 tablet (100 mg total) by mouth at bedtime. (Patient taking differently: Take 200 mg by mouth at bedtime. ), Disp: 30 tablet, Rfl: 0   Review of Systems  Constitutional: Positive for fatigue. Negative for appetite change, chills, diaphoresis, fever and unexpected weight change.  HENT: Negative for congestion, dental problem, drooling, ear pain, facial swelling, hearing loss, mouth sores, sneezing, sore throat, trouble swallowing and voice change.   Eyes: Negative for pain, discharge, redness, itching and visual disturbance.  Respiratory: Negative for cough, choking, shortness of breath and wheezing.   Cardiovascular: Positive for leg swelling (foot). Negative for chest pain and palpitations.  Gastrointestinal: Negative for abdominal pain, blood in stool, constipation, diarrhea and vomiting.  Endocrine: Negative for cold intolerance, heat intolerance and polydipsia.  Genitourinary: Negative for decreased urine volume, dysuria and hematuria.  Musculoskeletal: Positive for gait problem. Negative for arthralgias and back pain.  Skin: Negative for rash.  Allergic/Immunologic:  Negative for environmental allergies.  Neurological: Negative for seizures, syncope, light-headedness and headaches.  Hematological: Negative for adenopathy.  Psychiatric/Behavioral: Negative for agitation, dysphoric mood and suicidal ideas. The patient is not nervous/anxious.     Per HPI unless specifically indicated above     Objective:    BP 126/76 (BP Location: Left Arm, Patient Position: Sitting, Cuff Size: Large)   Pulse 66   Temp 98.1 F (36.7 C)   Ht 5' 2.5" (1.588 m)   Wt 256 lb 4 oz  (116.2 kg)   LMP  (LMP Unknown)   SpO2 96%   BMI 46.12 kg/m   Wt Readings from Last 3 Encounters:  09/01/16 256 lb 4 oz (116.2 kg)  08/31/16 255 lb 6.4 oz (115.8 kg)  08/12/16 180 lb (81.6 kg)    Physical Exam  Constitutional: She is oriented to person, place, and time. She appears well-developed and well-nourished.  HENT:  Head: Normocephalic and atraumatic.  Mouth/Throat: Oropharynx is clear and moist. No oropharyngeal exudate.  Eyes: Conjunctivae and EOM are normal. Pupils are equal, round, and reactive to light.  Neck: Neck supple. No thyromegaly present.  Cardiovascular: Normal rate and regular rhythm.   Pulmonary/Chest: Effort normal and breath sounds normal.  Abdominal: Soft. Bowel sounds are normal. She exhibits no mass. There is no hepatosplenomegaly. There is no tenderness.  Musculoskeletal: She exhibits no edema.       Right foot: There is tenderness. There is normal range of motion, no bony tenderness, no swelling, normal capillary refill and no deformity.       Feet:  Lymphadenopathy:    She has no cervical adenopathy.  Neurological: She is alert and oriented to person, place, and time. Gait normal.  Skin: Skin is warm and dry.  Psychiatric: She has a normal mood and affect. Her behavior is normal.  Vitals reviewed.   Results for orders placed or performed during the hospital encounter of 08/12/16  Comprehensive metabolic panel  Result Value Ref Range   Sodium 141 135 - 145 mmol/L   Potassium 3.1 (L) 3.5 - 5.1 mmol/L   Chloride 104 101 - 111 mmol/L   CO2 28 22 - 32 mmol/L   Glucose, Bld 104 (H) 65 - 99 mg/dL   BUN <5 (L) 6 - 20 mg/dL   Creatinine, Ser 1.61 0.44 - 1.00 mg/dL   Calcium 9.1 8.9 - 09.6 mg/dL   Total Protein 7.7 6.5 - 8.1 g/dL   Albumin 4.1 3.5 - 5.0 g/dL   AST 17 15 - 41 U/L   ALT 14 14 - 54 U/L   Alkaline Phosphatase 103 38 - 126 U/L   Total Bilirubin 0.3 0.3 - 1.2 mg/dL   GFR calc non Af Amer >60 >60 mL/min   GFR calc Af Amer >60 >60  mL/min   Anion gap 9 5 - 15  Ethanol  Result Value Ref Range   Alcohol, Ethyl (B) <5 <5 mg/dL  Salicylate level  Result Value Ref Range   Salicylate Lvl <7.0 2.8 - 30.0 mg/dL  Acetaminophen level  Result Value Ref Range   Acetaminophen (Tylenol), Serum <10 (L) 10 - 30 ug/mL  cbc  Result Value Ref Range   WBC 12.1 (H) 3.6 - 11.0 K/uL   RBC 4.46 3.80 - 5.20 MIL/uL   Hemoglobin 13.1 12.0 - 16.0 g/dL   HCT 04.5 40.9 - 81.1 %   MCV 87.4 80.0 - 100.0 fL   MCH 29.4 26.0 - 34.0 pg   MCHC 33.7  32.0 - 36.0 g/dL   RDW 19.1 47.8 - 29.5 %   Platelets 331 150 - 440 K/uL  Urine Drug Screen, Qualitative  Result Value Ref Range   Tricyclic, Ur Screen NONE DETECTED NONE DETECTED   Amphetamines, Ur Screen NONE DETECTED NONE DETECTED   MDMA (Ecstasy)Ur Screen NONE DETECTED NONE DETECTED   Cocaine Metabolite,Ur [place] POSITIVE (A) NONE DETECTED   Opiate, Ur Screen NONE DETECTED NONE DETECTED   Phencyclidine (PCP) Ur S NONE DETECTED NONE DETECTED   Cannabinoid 50 Ng, Ur [place] POSITIVE (A) NONE DETECTED   Barbiturates, Ur Screen NONE DETECTED NONE DETECTED   Benzodiazepine, Ur Scrn POSITIVE (A) NONE DETECTED   Methadone Scn, Ur NONE DETECTED NONE DETECTED      Assessment & Plan:    Encounter Diagnoses  Name Primary?  . Right foot pain Yes  . Hypokalemia   . OSA (obstructive sleep apnea)   . Substance abuse in remission   . Cigarette nicotine dependence with nicotine-induced disorder   . Chronic obstructive pulmonary disease, unspecified COPD type (HCC)   . Morbid obesity (HCC)     -recent hospital labs reviewed.  K+ low.  Will repeat test -Discussed mammogram screening guidelines- Pt agrees to start age 43 -discussed with pt fx foot unlikely but will get xray in light of continuing pain.  Will rx diclofenac for pain.  Pt is to Ice the foot 10-15 minutes 4-5 times daily for next week.  Elevate the foot.  Avoid walking barefoot.  Avoid prolonged walking for one week. -she is given [facility] discount  application for the xray -pt to follow up in one month.  She is to RTO sooner prn worsening or new symptoms

## 2016-09-06 ENCOUNTER — Other Ambulatory Visit (HOSPITAL_COMMUNITY)
Admission: RE | Admit: 2016-09-06 | Discharge: 2016-09-06 | Disposition: A | Discharge status: Home / Self Care | Payer: Self-pay | Source: Ambulatory Visit | Attending: Physician Assistant | Admitting: Physician Assistant

## 2016-09-06 LAB — POTASSIUM: POTASSIUM: 4 mmol/L (ref 3.5–5.1)

## 2016-09-08 ENCOUNTER — Telehealth: Payer: Self-pay

## 2016-09-08 NOTE — Telephone Encounter (Signed)
Called to follow up with client [facility]. Client states it went well and that she had an x-ray of her right foot ,and had been following instructions from provider, however, she is still complaining of pain and swelling in her right foot. She states her next appointment is in June. RN encouraged client to call the [facility] to let them know her continued symptoms and how she should proceed. Client states she will do so.  Will continue to follow as needed.

## 2016-09-09 ENCOUNTER — Encounter: Payer: Self-pay | Admitting: Physician Assistant

## 2016-09-09 ENCOUNTER — Ambulatory Visit: Payer: Self-pay | Admitting: Physician Assistant

## 2016-09-09 VITALS — BP 128/84 | HR 76 | Temp 97.5°F | Ht 62.5 in | Wt 262.0 lb

## 2016-09-09 DIAGNOSIS — R609 Edema, unspecified: Secondary | ICD-10-CM

## 2016-09-09 DIAGNOSIS — M79671 Pain in right foot: Secondary | ICD-10-CM

## 2016-09-09 NOTE — Patient Instructions (Signed)
DASH Eating Plan DASH stands for "Dietary Approaches to Stop Hypertension." The DASH eating plan is a healthy eating plan that has been shown to reduce high blood pressure (hypertension). It may also reduce your risk for type 2 diabetes, heart disease, and stroke. The DASH eating plan may also help with weight loss. What are tips for following this plan? General guidelines  Avoid eating more than 2,300 mg (milligrams) of salt (sodium) a day. If you have hypertension, you may need to reduce your sodium intake to 1,500 mg a day.  Limit alcohol intake to no more than 1 drink a day for nonpregnant women and 2 drinks a day for men. One drink equals 12 oz of beer, 5 oz of wine, or 1 oz of hard liquor.  Work with your health care provider to maintain a healthy body weight or to lose weight. Ask what an ideal weight is for you.  Get at least 30 minutes of exercise that causes your heart to beat faster (aerobic exercise) most days of the week. Activities may include walking, swimming, or biking.  Work with your health care provider or diet and nutrition specialist (dietitian) to adjust your eating plan to your individual calorie needs. Reading food labels  Check food labels for the amount of sodium per serving. Choose foods with less than 5 percent of the Daily Value of sodium. Generally, foods with less than 300 mg of sodium per serving fit into this eating plan.  To find whole grains, look for the word "whole" as the first word in the ingredient list. Shopping  Buy products labeled as "low-sodium" or "no salt added."  Buy fresh foods. Avoid canned foods and premade or frozen meals. Cooking  Avoid adding salt when cooking. Use salt-free seasonings or herbs instead of table salt or sea salt. Check with your health care provider or pharmacist before using salt substitutes.  Do not fry foods. Cook foods using healthy methods such as baking, boiling, grilling, and broiling instead.  Cook with  heart-healthy oils, such as olive, canola, soybean, or sunflower oil. Meal planning   Eat a balanced diet that includes: ? 5 or more servings of fruits and vegetables each day. At each meal, try to fill half of your plate with fruits and vegetables. ? Up to 6-8 servings of whole grains each day. ? Less than 6 oz of lean meat, poultry, or fish each day. A 3-oz serving of meat is about the same size as a deck of cards. One egg equals 1 oz. ? 2 servings of low-fat dairy each day. ? A serving of nuts, seeds, or beans 5 times each week. ? Heart-healthy fats. Healthy fats called Omega-3 fatty acids are found in foods such as flaxseeds and coldwater fish, like sardines, salmon, and mackerel.  Limit how much you eat of the following: ? Canned or prepackaged foods. ? Food that is high in trans fat, such as fried foods. ? Food that is high in saturated fat, such as fatty meat. ? Sweets, desserts, sugary drinks, and other foods with added sugar. ? Full-fat dairy products.  Do not salt foods before eating.  Try to eat at least 2 vegetarian meals each week.  Eat more home-cooked food and less restaurant, buffet, and fast food.  When eating at a restaurant, ask that your food be prepared with less salt or no salt, if possible. What foods are recommended? The items listed may not be a complete list. Talk with your dietitian about what   dietary choices are best for you. Grains Whole-grain or whole-wheat bread. Whole-grain or whole-wheat pasta. Brown rice. Oatmeal. Quinoa. Bulgur. Whole-grain and low-sodium cereals. Pita bread. Low-fat, low-sodium crackers. Whole-wheat flour tortillas. Vegetables Fresh or frozen vegetables (raw, steamed, roasted, or grilled). Low-sodium or reduced-sodium tomato and vegetable juice. Low-sodium or reduced-sodium tomato sauce and tomato paste. Low-sodium or reduced-sodium canned vegetables. Fruits All fresh, dried, or frozen fruit. Canned fruit in natural juice (without  added sugar). Meat and other protein foods Skinless chicken or turkey. Ground chicken or turkey. Pork with fat trimmed off. Fish and seafood. Egg whites. Dried beans, peas, or lentils. Unsalted nuts, nut butters, and seeds. Unsalted canned beans. Lean cuts of beef with fat trimmed off. Low-sodium, lean deli meat. Dairy Low-fat (1%) or fat-free (skim) milk. Fat-free, low-fat, or reduced-fat cheeses. Nonfat, low-sodium ricotta or cottage cheese. Low-fat or nonfat yogurt. Low-fat, low-sodium cheese. Fats and oils Soft margarine without trans fats. Vegetable oil. Low-fat, reduced-fat, or light mayonnaise and salad dressings (reduced-sodium). Canola, safflower, olive, soybean, and sunflower oils. Avocado. Seasoning and other foods Herbs. Spices. Seasoning mixes without salt. Unsalted popcorn and pretzels. Fat-free sweets. What foods are not recommended? The items listed may not be a complete list. Talk with your dietitian about what dietary choices are best for you. Grains Baked goods made with fat, such as croissants, muffins, or some breads. Dry pasta or rice meal packs. Vegetables Creamed or fried vegetables. Vegetables in a cheese sauce. Regular canned vegetables (not low-sodium or reduced-sodium). Regular canned tomato sauce and paste (not low-sodium or reduced-sodium). Regular tomato and vegetable juice (not low-sodium or reduced-sodium). Pickles. Olives. Fruits Canned fruit in a light or heavy syrup. Fried fruit. Fruit in cream or butter sauce. Meat and other protein foods Fatty cuts of meat. Ribs. Fried meat. Bacon. Sausage. Bologna and other processed lunch meats. Salami. Fatback. Hotdogs. Bratwurst. Salted nuts and seeds. Canned beans with added salt. Canned or smoked fish. Whole eggs or egg yolks. Chicken or turkey with skin. Dairy Whole or 2% milk, cream, and half-and-half. Whole or full-fat cream cheese. Whole-fat or sweetened yogurt. Full-fat cheese. Nondairy creamers. Whipped toppings.  Processed cheese and cheese spreads. Fats and oils Butter. Stick margarine. Lard. Shortening. Ghee. Bacon fat. Tropical oils, such as coconut, palm kernel, or palm oil. Seasoning and other foods Salted popcorn and pretzels. Onion salt, garlic salt, seasoned salt, table salt, and sea salt. Worcestershire sauce. Tartar sauce. Barbecue sauce. Teriyaki sauce. Soy sauce, including reduced-sodium. Steak sauce. Canned and packaged gravies. Fish sauce. Oyster sauce. Cocktail sauce. Horseradish that you find on the shelf. Ketchup. Mustard. Meat flavorings and tenderizers. Bouillon cubes. Hot sauce and Tabasco sauce. Premade or packaged marinades. Premade or packaged taco seasonings. Relishes. Regular salad dressings. Where to find more information:  National Heart, Lung, and Blood Institute: www.nhlbi.nih.gov  American Heart Association: www.heart.org Summary  The DASH eating plan is a healthy eating plan that has been shown to reduce high blood pressure (hypertension). It may also reduce your risk for type 2 diabetes, heart disease, and stroke.  With the DASH eating plan, you should limit salt (sodium) intake to 2,300 mg a day. If you have hypertension, you may need to reduce your sodium intake to 1,500 mg a day.  When on the DASH eating plan, aim to eat more fresh fruits and vegetables, whole grains, lean proteins, low-fat dairy, and heart-healthy fats.  Work with your health care provider or diet and nutrition specialist (dietitian) to adjust your eating plan to your individual   calorie needs. This information is not intended to replace advice given to you by your health care provider. Make sure you discuss any questions you have with your health care provider. Document Released: 04/08/2011 Document Revised: 04/12/2016 Document Reviewed: 04/12/2016 Elsevier Interactive Patient Education  2017 [employer]  

## 2016-09-09 NOTE — Progress Notes (Signed)
BP 128/84 (BP Location: Left Arm, Patient Position: Sitting, Cuff Size: Large)   Pulse 76   Temp 97.5 F (36.4 C)   Ht 5' 2.5" (1.588 m)   Wt 262 lb (118.8 kg)   LMP  (LMP Unknown)   SpO2 96%   BMI 47.16 kg/m    Subjective:    Patient ID: Tracie Poundsmily K Karbowski, female    DOB: 1976/05/01, 41 y.o.   MRN: [id number]  HPI: Tracie Hodge is a 41 y.o. female presenting on 09/09/2016 for Foot Pain (and swelling)   HPI   Pt has not started the diclofenac.  She is back stating foot still hurts and is swelled.   Relevant past medical, surgical, family and social history reviewed and updated as indicated. Interim medical history since our last visit reviewed. Allergies and medications reviewed and updated.   Current Outpatient Prescriptions:  .  albuterol (PROVENTIL HFA;VENTOLIN HFA) 108 (90 Base) MCG/ACT inhaler, Inhale 1-2 puffs into the lungs every 4 (four) hours as needed for wheezing or shortness of breath., Disp: 1 Inhaler, Rfl: 0 .  FLUoxetine (PROZAC) 20 MG capsule, Take 1 capsule (20 mg total) by mouth daily., Disp: 30 capsule, Rfl: 0 .  ibuprofen (ADVIL,MOTRIN) 600 MG tablet, Take 1 tablet (600 mg total) by mouth every 6 (six) hours as needed for moderate pain., Disp: 30 tablet, Rfl: 0 .  pantoprazole (PROTONIX) 40 MG tablet, Take 1 tablet (40 mg total) by mouth daily., Disp: 30 tablet, Rfl: 0 .  traZODone (DESYREL) 100 MG tablet, Take 1 tablet (100 mg total) by mouth at bedtime. (Patient taking differently: Take 200 mg by mouth at bedtime. ), Disp: 30 tablet, Rfl: 0 .  diclofenac (VOLTAREN) 75 MG EC tablet, Take 1 tablet (75 mg total) by mouth 2 (two) times daily. (Patient not taking: Reported on 09/09/2016), Disp: 60 tablet, Rfl: 0   Review of Systems  Constitutional: Positive for fatigue. Negative for appetite change, chills, diaphoresis, fever and unexpected weight change.  HENT: Negative for congestion, dental problem, drooling, ear pain, facial swelling, hearing loss, mouth  sores, sneezing, sore throat, trouble swallowing and voice change.   Eyes: Negative for pain, discharge, redness, itching and visual disturbance.  Respiratory: Negative for cough, choking, shortness of breath and wheezing.   Cardiovascular: Positive for leg swelling. Negative for chest pain and palpitations.  Gastrointestinal: Negative for abdominal pain, blood in stool, constipation, diarrhea and vomiting.  Endocrine: Negative for cold intolerance, heat intolerance and polydipsia.  Genitourinary: Negative for decreased urine volume, dysuria and hematuria.  Musculoskeletal: Negative for arthralgias, back pain and gait problem.  Skin: Negative for rash.  Allergic/Immunologic: Negative for environmental allergies.  Neurological: Negative for seizures, syncope, light-headedness and headaches.  Hematological: Negative for adenopathy.  Psychiatric/Behavioral: Negative for agitation, dysphoric mood and suicidal ideas. The patient is not nervous/anxious.     Per HPI unless specifically indicated above     Objective:    BP 128/84 (BP Location: Left Arm, Patient Position: Sitting, Cuff Size: Large)   Pulse 76   Temp 97.5 F (36.4 C)   Ht 5' 2.5" (1.588 m)   Wt 262 lb (118.8 kg)   LMP  (LMP Unknown)   SpO2 96%   BMI 47.16 kg/m   Wt Readings from Last 3 Encounters:  09/09/16 262 lb (118.8 kg)  09/01/16 256 lb 4 oz (116.2 kg)  08/31/16 255 lb 6.4 oz (115.8 kg)    Physical Exam  Constitutional: She is oriented to person, place, and  time. She appears well-developed and well-nourished.  HENT:  Head: Normocephalic and atraumatic.  Neck: Neck supple.  Cardiovascular: Normal rate and regular rhythm.   Pulmonary/Chest: Effort normal and breath sounds normal.  Abdominal: Soft. Bowel sounds are normal. She exhibits no mass. There is no hepatosplenomegaly. There is no tenderness.  Musculoskeletal: She exhibits edema (1 + ble edema).       Right foot: There is normal range of motion, no  tenderness, no bony tenderness and normal capillary refill.  Lymphadenopathy:    She has no cervical adenopathy.  Neurological: She is alert and oriented to person, place, and time.  Skin: Skin is warm and dry.  Psychiatric: She has a normal mood and affect. Her behavior is normal.  Vitals reviewed.   Results for orders placed or performed during the hospital encounter of 09/06/16  Potassium  Result Value Ref Range   Potassium 4.0 3.5 - 5.1 mmol/L      Assessment & Plan:    Encounter Diagnoses  Name Primary?  . Right foot pain Yes  . Edema, unspecified type   . Morbid obesity (HCC)     -Pt counseled to get diclofenac and take it for pain and inflammation -pt counseled to elevate legs when seated.   -pt to ice the R foot 10-20 minutes 3-4 times daily -she is told that walking helps LE edema -pt counseled on low sodium diet and given handout -pt to follow up in 3 months. RTO sooner prn

## 2016-09-21 ENCOUNTER — Other Ambulatory Visit: Payer: Self-pay | Admitting: Physician Assistant

## 2016-09-21 MED ORDER — ALBUTEROL SULFATE HFA 108 (90 BASE) MCG/ACT IN AERS: 1.0000 | INHALATION_SPRAY | RESPIRATORY_TRACT | 1 refills | Status: DC | PRN

## 2016-09-23 ENCOUNTER — Other Ambulatory Visit: Payer: Self-pay | Admitting: Physician Assistant

## 2016-09-23 MED ORDER — ALBUTEROL SULFATE HFA 108 (90 BASE) MCG/ACT IN AERS: 1.0000 | INHALATION_SPRAY | RESPIRATORY_TRACT | 1 refills | Status: DC | PRN

## 2016-09-28 ENCOUNTER — Encounter: Payer: Self-pay | Admitting: Physician Assistant

## 2016-09-28 ENCOUNTER — Ambulatory Visit: Payer: Self-pay | Admitting: Physician Assistant

## 2016-09-28 VITALS — BP 126/66 | HR 76 | Temp 97.9°F | Ht 62.5 in | Wt 267.0 lb

## 2016-09-28 DIAGNOSIS — F17219 Nicotine dependence, cigarettes, with unspecified nicotine-induced disorders: Secondary | ICD-10-CM

## 2016-09-28 DIAGNOSIS — J069 Acute upper respiratory infection, unspecified: Secondary | ICD-10-CM

## 2016-09-28 MED ORDER — BENZONATATE 100 MG PO CAPS: ORAL_CAPSULE | ORAL | 3 refills | Status: DC

## 2016-09-28 NOTE — Progress Notes (Signed)
BP 126/66 (BP Location: Left Arm, Patient Position: Sitting, Cuff Size: Large)   Pulse 76   Temp 97.9 F (36.6 C) (Other (Comment))   Ht 5' 2.5" (1.588 m)   Wt 267 lb (121.1 kg)   SpO2 95%   BMI 48.06 kg/m    Subjective:    Patient ID: Tracie Hodge, female    DOB: 1976-04-29, 41 y.o.   MRN: [id number]  HPI: Tracie Hodge is a 41 y.o. female presenting on 09/28/2016 for Headache (c/o sinus type headache since yesterday morning, tylenol and benadryl helped a little)   HPI   Chief Complaint  Patient presents with  . Headache    c/o sinus type headache since yesterday morning, tylenol and benadryl helped a little    Symptoms started yesterday.  Pt lives at [facility] house.    Ears feel stopped up and like they want to drain a little bit.  + cough.  Little bit of wheezing.    Relevant past medical, surgical, family and social history reviewed and updated as indicated. Interim medical history since our last visit reviewed. Allergies and medications reviewed and updated.   Current Outpatient Prescriptions:  .  albuterol (PROVENTIL HFA;VENTOLIN HFA) 108 (90 Base) MCG/ACT inhaler, Inhale 1-2 puffs into the lungs every 4 (four) hours as needed for wheezing or shortness of breath., Disp: 3 Inhaler, Rfl: 1 .  diclofenac (VOLTAREN) 75 MG EC tablet, Take 1 tablet (75 mg total) by mouth 2 (two) times daily., Disp: 60 tablet, Rfl: 0 .  FLUoxetine (PROZAC) 20 MG capsule, Take 1 capsule (20 mg total) by mouth daily., Disp: 30 capsule, Rfl: 0 .  omeprazole (PRILOSEC) 20 MG capsule, Take 20 mg by mouth daily., Disp: , Rfl:  .  traZODone (DESYREL) 100 MG tablet, Take 1 tablet (100 mg total) by mouth at bedtime. (Patient taking differently: Take 200 mg by mouth at bedtime. ), Disp: 30 tablet, Rfl: 0  Review of Systems  Constitutional: Negative for fever.  HENT: Positive for congestion, ear pain, rhinorrhea, sinus pressure, sneezing and sore throat.   Respiratory: Positive for cough and  wheezing. Negative for shortness of breath.   Gastrointestinal: Negative for abdominal pain.    Per HPI unless specifically indicated above     Objective:    BP 126/66 (BP Location: Left Arm, Patient Position: Sitting, Cuff Size: Large)   Pulse 76   Temp 97.9 F (36.6 C) (Other (Comment))   Ht 5' 2.5" (1.588 m)   Wt 267 lb (121.1 kg)   SpO2 95%   BMI 48.06 kg/m   Wt Readings from Last 3 Encounters:  09/28/16 267 lb (121.1 kg)  09/09/16 262 lb (118.8 kg)  09/01/16 256 lb 4 oz (116.2 kg)    Physical Exam  Constitutional: She is oriented to person, place, and time. She appears well-developed and well-nourished.  HENT:  Head: Normocephalic and atraumatic.  Right Ear: Hearing, tympanic membrane, external ear and ear canal normal.  Left Ear: Hearing, tympanic membrane, external ear and ear canal normal.  Nose: Mucosal edema and rhinorrhea present.  Mouth/Throat: Uvula is midline and oropharynx is clear and moist. No oropharyngeal exudate.  Neck: Neck supple.  Cardiovascular: Normal rate and regular rhythm.   Pulmonary/Chest: Effort normal and breath sounds normal. She has no wheezes.  Lymphadenopathy:    She has no cervical adenopathy.  Neurological: She is alert and oriented to person, place, and time.  Skin: Skin is warm and dry.  Psychiatric: She has a  normal mood and affect. Her behavior is normal.  Vitals reviewed.   Results for orders placed or performed during the hospital encounter of 09/06/16  Potassium  Result Value Ref Range   Potassium 4.0 3.5 - 5.1 mmol/L      Assessment & Plan:    Encounter Diagnoses  Name Primary?  . Acute URI Yes  . Cigarette nicotine dependence with nicotine-induced disorder     -Gave sample sinus med- guaifenesin, phenylephrine and apap. -rx tessalon -pt to Rest, increase fluids, avoid smoking -follow up as scheduled. RTO sooner prn

## 2016-09-28 NOTE — Patient Instructions (Signed)
Upper Respiratory Infection, Adult Most upper respiratory infections (URIs) are a viral infection of the air passages leading to the lungs. A URI affects the nose, throat, and upper air passages. The most common type of URI is nasopharyngitis and is typically referred to as "the common cold." URIs run their course and usually go away on their own. Most of the time, a URI does not require medical attention, but sometimes a bacterial infection in the upper airways can follow a viral infection. This is called a secondary infection. Sinus and middle ear infections are common types of secondary upper respiratory infections. Bacterial pneumonia can also complicate a URI. A URI can worsen asthma and chronic obstructive pulmonary disease (COPD). Sometimes, these complications can require emergency medical care and may be life threatening. What are the causes? Almost all URIs are caused by viruses. A virus is a type of germ and can spread from one person to another. What increases the risk? You may be at risk for a URI if:  You smoke.  You have chronic heart or lung disease.  You have a weakened defense (immune) system.  You are very young or very old.  You have nasal allergies or asthma.  You work in crowded or poorly ventilated areas.  You work in health care facilities or schools.  What are the signs or symptoms? Symptoms typically develop 2-3 days after you come in contact with a cold virus. Most viral URIs last 7-10 days. However, viral URIs from the influenza virus (flu virus) can last 14-18 days and are typically more severe. Symptoms may include:  Runny or stuffy (congested) nose.  Sneezing.  Cough.  Sore throat.  Headache.  Fatigue.  Fever.  Loss of appetite.  Pain in your forehead, behind your eyes, and over your cheekbones (sinus pain).  Muscle aches.  How is this diagnosed? Your health care provider may diagnose a URI by:  Physical exam.  Tests to check that your  symptoms are not due to another condition such as: ? Strep throat. ? Sinusitis. ? Pneumonia. ? Asthma.  How is this treated? A URI goes away on its own with time. It cannot be cured with medicines, but medicines may be prescribed or recommended to relieve symptoms. Medicines may help:  Reduce your fever.  Reduce your cough.  Relieve nasal congestion.  Follow these instructions at home:  Take medicines only as directed by your health care provider.  Gargle warm saltwater or take cough drops to comfort your throat as directed by your health care provider.  Use a warm mist humidifier or inhale steam from a shower to increase air moisture. This may make it easier to breathe.  Drink enough fluid to keep your urine clear or pale yellow.  Eat soups and other clear broths and maintain good nutrition.  Rest as needed.  Return to work when your temperature has returned to normal or as your health care provider advises. You may need to stay home longer to avoid infecting others. You can also use a face mask and careful hand washing to prevent spread of the virus.  Increase the usage of your inhaler if you have asthma.  Do not use any tobacco products, including cigarettes, chewing tobacco, or electronic cigarettes. If you need help quitting, ask your health care provider. How is this prevented? The best way to protect yourself from getting a cold is to practice good hygiene.  Avoid oral or hand contact with people with cold symptoms.  Wash your   hands often if contact occurs.  There is no clear evidence that vitamin C, vitamin E, echinacea, or exercise reduces the chance of developing a cold. However, it is always recommended to get plenty of rest, exercise, and practice good nutrition. Contact a health care provider if:  You are getting worse rather than better.  Your symptoms are not controlled by medicine.  You have chills.  You have worsening shortness of breath.  You have  brown or red mucus.  You have yellow or brown nasal discharge.  You have pain in your face, especially when you bend forward.  You have a fever.  You have swollen neck glands.  You have pain while swallowing.  You have white areas in the back of your throat. Get help right away if:  You have severe or persistent: ? Headache. ? Ear pain. ? Sinus pain. ? Chest pain.  You have chronic lung disease and any of the following: ? Wheezing. ? Prolonged cough. ? Coughing up blood. ? A change in your usual mucus.  You have a stiff neck.  You have changes in your: ? Vision. ? Hearing. ? Thinking. ? Mood. This information is not intended to replace advice given to you by your health care provider. Make sure you discuss any questions you have with your health care provider. Document Released: 10/13/2000 Document Revised: 12/21/2015 Document Reviewed: 07/25/2013 Elsevier Interactive Patient Education  2017 [employer]  

## 2016-10-05 ENCOUNTER — Ambulatory Visit: Payer: Self-pay | Admitting: Physician Assistant

## 2016-10-11 ENCOUNTER — Other Ambulatory Visit (HOSPITAL_COMMUNITY)
Admission: RE | Admit: 2016-10-11 | Discharge: 2016-10-11 | Disposition: A | Discharge status: Home / Self Care | Payer: Self-pay | Source: Ambulatory Visit | Attending: Physician Assistant | Admitting: Physician Assistant

## 2016-10-11 ENCOUNTER — Encounter: Payer: Self-pay | Admitting: Physician Assistant

## 2016-10-11 ENCOUNTER — Ambulatory Visit: Payer: Self-pay | Admitting: Physician Assistant

## 2016-10-11 VITALS — BP 114/74 | HR 70 | Temp 97.7°F | Ht 62.5 in | Wt 272.0 lb

## 2016-10-11 DIAGNOSIS — R5383 Other fatigue: Secondary | ICD-10-CM

## 2016-10-11 DIAGNOSIS — R52 Pain, unspecified: Secondary | ICD-10-CM

## 2016-10-11 LAB — CBC WITH DIFFERENTIAL/PLATELET
BASOS PCT: 0 %
Basophils Absolute: 0 10*3/uL (ref 0.0–0.1)
Eosinophils Absolute: 0.4 10*3/uL (ref 0.0–0.7)
Eosinophils Relative: 4 %
HEMATOCRIT: 38.1 % (ref 36.0–46.0)
HEMOGLOBIN: 12.1 g/dL (ref 12.0–15.0)
LYMPHS ABS: 3.5 10*3/uL (ref 0.7–4.0)
Lymphocytes Relative: 33 %
MCH: 29.2 pg (ref 26.0–34.0)
MCHC: 31.8 g/dL (ref 30.0–36.0)
MCV: 91.8 fL (ref 78.0–100.0)
MONOS PCT: 6 %
Monocytes Absolute: 0.6 10*3/uL (ref 0.1–1.0)
NEUTROS ABS: 6.2 10*3/uL (ref 1.7–7.7)
NEUTROS PCT: 57 %
Platelets: 317 10*3/uL (ref 150–400)
RBC: 4.15 MIL/uL (ref 3.87–5.11)
RDW: 14 % (ref 11.5–15.5)
WBC: 10.8 10*3/uL — ABNORMAL HIGH (ref 4.0–10.5)

## 2016-10-11 MED ORDER — DICLOFENAC SODIUM 75 MG PO TBEC: 75.0000 mg | DELAYED_RELEASE_TABLET | Freq: Two times a day (BID) | ORAL | 1 refills | Status: DC | PRN

## 2016-10-11 NOTE — Progress Notes (Signed)
BP 114/74 (BP Location: Left Arm, Patient Position: Sitting, Cuff Size: Large)   Pulse 70   Temp 97.7 F (36.5 C) (Other (Comment))   Ht 5' 2.5" (1.588 m)   Wt 272 lb (123.4 kg)   SpO2 96%   BMI 48.96 kg/m    Subjective:    Patient ID: Tracie Poundsmily K Bumpus, female    DOB: 05/26/1975, 41 y.o.   MRN: [id number]  HPI: Tracie Hodge is a 41 y.o. female presenting on 10/11/2016 for Nasal Congestion (c/o low grade fever and body aches for 2 weeks)   HPI  Chief Complaint  Patient presents with  . Nasal Congestion    c/o low grade fever and body aches for 2 weeks    Pt states temperature "right at 99".  Pt states feet hurting since running out of the diclofenac.  Says she just feels achey and sluggish and is congested.    Pt living at [facility] house now and has gained 16 pounds since her new patient appointment on 09/01/16.    Pt says she feels like they work her very hard at the house and she is always doing something and is very tired.     Relevant past medical, surgical, family and social history reviewed and updated as indicated. Interim medical history since our last visit reviewed. Allergies and medications reviewed and updated.   Current Outpatient Prescriptions:  .  albuterol (PROVENTIL HFA;VENTOLIN HFA) 108 (90 Base) MCG/ACT inhaler, Inhale 1-2 puffs into the lungs every 4 (four) hours as needed for wheezing or shortness of breath., Disp: 3 Inhaler, Rfl: 1 .  FLUoxetine (PROZAC) 20 MG capsule, Take 1 capsule (20 mg total) by mouth daily., Disp: 30 capsule, Rfl: 0 .  omeprazole (PRILOSEC) 20 MG capsule, Take 20 mg by mouth daily., Disp: , Rfl:  .  traZODone (DESYREL) 100 MG tablet, Take 1 tablet (100 mg total) by mouth at bedtime. (Patient taking differently: Take 200 mg by mouth at bedtime. ), Disp: 30 tablet, Rfl: 0 .  benzonatate (TESSALON) 100 MG capsule, 1-2 po q 8 hour prn cough (Patient not taking: Reported on 10/11/2016), Disp: 14 capsule, Rfl: 3 .  diclofenac (VOLTAREN) 75  MG EC tablet, Take 1 tablet (75 mg total) by mouth 2 (two) times daily. (Patient not taking: Reported on 10/11/2016), Disp: 60 tablet, Rfl: 0   Review of Systems  Constitutional: Positive for fatigue and fever. Negative for appetite change, chills, diaphoresis and unexpected weight change.  HENT: Positive for congestion and sneezing. Negative for dental problem, drooling, ear pain, facial swelling, hearing loss, mouth sores, sore throat, trouble swallowing and voice change.   Eyes: Negative for pain, discharge, redness, itching and visual disturbance.  Respiratory: Positive for shortness of breath. Negative for cough, choking and wheezing.   Cardiovascular: Negative for chest pain, palpitations and leg swelling.  Gastrointestinal: Negative for abdominal pain, blood in stool, constipation, diarrhea and vomiting.  Endocrine: Negative for cold intolerance, heat intolerance and polydipsia.  Genitourinary: Negative for decreased urine volume, dysuria and hematuria.  Musculoskeletal: Positive for back pain. Negative for arthralgias and gait problem.  Skin: Negative for rash.  Allergic/Immunologic: Positive for environmental allergies.  Neurological: Positive for headaches. Negative for seizures, syncope and light-headedness.  Hematological: Negative for adenopathy.  Psychiatric/Behavioral: Negative for agitation, dysphoric mood and suicidal ideas. The patient is not nervous/anxious.     Per HPI unless specifically indicated above     Objective:    BP 114/74 (BP Location: Left Arm, Patient  Position: Sitting, Cuff Size: Large)   Pulse 70   Temp 97.7 F (36.5 C) (Other (Comment))   Ht 5' 2.5" (1.588 m)   Wt 272 lb (123.4 kg)   SpO2 96%   BMI 48.96 kg/m   Wt Readings from Last 3 Encounters:  10/11/16 272 lb (123.4 kg)  09/28/16 267 lb (121.1 kg)  09/09/16 262 lb (118.8 kg)    Physical Exam  Constitutional: She is oriented to person, place, and time. She appears well-developed and  well-nourished.  HENT:  Head: Normocephalic and atraumatic.  Right Ear: Hearing, tympanic membrane, external ear and ear canal normal.  Left Ear: Hearing, tympanic membrane, external ear and ear canal normal.  Nose: Nose normal.  Mouth/Throat: Uvula is midline and oropharynx is clear and moist. No oropharyngeal exudate.  Neck: Neck supple.  Cardiovascular: Normal rate and regular rhythm.   Pulmonary/Chest: Effort normal and breath sounds normal. She has no wheezes.  Abdominal: Soft. Bowel sounds are normal. She exhibits no mass. There is no hepatosplenomegaly. There is no tenderness.  Musculoskeletal: She exhibits no edema.  Lymphadenopathy:    She has no cervical adenopathy.  Neurological: She is alert and oriented to person, place, and time.  Skin: Skin is warm and dry.  Psychiatric: She has a normal mood and affect. Her behavior is normal.  Vitals reviewed.       Assessment & Plan:   Encounter Diagnoses  Name Primary?  . Fatigue, unspecified type Yes  . Aches   . Morbid obesity (HCC)       -discussed with pt that temperature of 99 does not constitute a fever and reassured her that her exam was normal.  Discussed that the huge changes in her life recently are likely to account for her feeling tired and achey.  Also discussed her weight increase and that it too is likely contributing.  Pt states understanding and agrees.  Will check cbc to make sure normal.  Pt counseled to try to eat nutritious meals, get plenty of sleep and drink plenty fluids.   Pt to follow up in august as scheduled.  RTO sooner prn

## 2016-10-15 ENCOUNTER — Emergency Department (HOSPITAL_COMMUNITY)
Admission: EM | Admit: 2016-10-15 | Discharge: 2016-10-15 | Disposition: A | Discharge status: Home / Self Care | Payer: Self-pay | Attending: Emergency Medicine | Admitting: Emergency Medicine

## 2016-10-15 ENCOUNTER — Encounter (HOSPITAL_COMMUNITY): Payer: Self-pay

## 2016-10-15 DIAGNOSIS — F329 Major depressive disorder, single episode, unspecified: Secondary | ICD-10-CM | POA: Insufficient documentation

## 2016-10-15 DIAGNOSIS — F17200 Nicotine dependence, unspecified, uncomplicated: Secondary | ICD-10-CM | POA: Insufficient documentation

## 2016-10-15 DIAGNOSIS — J45909 Unspecified asthma, uncomplicated: Secondary | ICD-10-CM | POA: Insufficient documentation

## 2016-10-15 DIAGNOSIS — R519 Headache, unspecified: Secondary | ICD-10-CM

## 2016-10-15 DIAGNOSIS — F1420 Cocaine dependence, uncomplicated: Secondary | ICD-10-CM | POA: Insufficient documentation

## 2016-10-15 DIAGNOSIS — R0981 Nasal congestion: Secondary | ICD-10-CM | POA: Insufficient documentation

## 2016-10-15 DIAGNOSIS — R51 Headache: Secondary | ICD-10-CM | POA: Insufficient documentation

## 2016-10-15 DIAGNOSIS — J4521 Mild intermittent asthma with (acute) exacerbation: Secondary | ICD-10-CM | POA: Insufficient documentation

## 2016-10-15 DIAGNOSIS — Z9049 Acquired absence of other specified parts of digestive tract: Secondary | ICD-10-CM | POA: Insufficient documentation

## 2016-10-15 MED ORDER — ALBUTEROL SULFATE HFA 108 (90 BASE) MCG/ACT IN AERS: 1.0000 | INHALATION_SPRAY | Freq: Four times a day (QID) | RESPIRATORY_TRACT | 0 refills | Status: DC | PRN

## 2016-10-15 MED ORDER — ALBUTEROL SULFATE HFA 108 (90 BASE) MCG/ACT IN AERS
2.0000 | INHALATION_SPRAY | Freq: Once | RESPIRATORY_TRACT | Status: AC
  Administered 2016-10-15: 2 via RESPIRATORY_TRACT
  Filled 2016-10-15: qty 6.7

## 2016-10-15 MED ORDER — FLUTICASONE PROPIONATE 50 MCG/ACT NA SUSP: 1.0000 | Freq: Every day | NASAL | 0 refills | Status: DC

## 2016-10-15 MED ORDER — LORATADINE 10 MG PO TABS: 10.0000 mg | ORAL_TABLET | Freq: Every day | ORAL | 0 refills | Status: DC

## 2016-10-15 MED ORDER — AZITHROMYCIN 250 MG PO TABS: 250.0000 mg | ORAL_TABLET | Freq: Every day | ORAL | 0 refills | Status: DC

## 2016-10-15 NOTE — ED Triage Notes (Signed)
Pt reports is at [facility] house and reports has been waking up with headache daily since memorial day.  Reports has asthma and is out of her inhaler.  Reports has been taking otc sinus medication without relief.

## 2016-10-15 NOTE — ED Provider Notes (Signed)
Emergency Department Provider Note   I have reviewed the triage vital signs and the nursing notes.   HISTORY  Chief Complaint Headache and Shortness of Breath   HPI Tracie Hodge is a 41 y.o. female with PMH of asthma, GERD, and depression presents to the emergency department for evaluation of worsening shortness of breath in the setting of being out of her asthma medication. She is also experiencing face pain, sinus congestion, morning headaches. She is currently in a treatment and rehabilitation facility. She used a friend's inhaler this morning because of some increased difficulty breathing. Unable to afford her albuterol inhaler or other medications. She has had similar sinus headaches in the past. No sudden onset, maximal intensity symptoms. No vision changes. No gait instability. No weakness or numbness. She is tried sinus medication with no relief. No radiation of symptoms. Symptoms seem worse in the AM.    Past Medical History:  Diagnosis Date  . Anxiety   . Asthma   . Depression   . GERD (gastroesophageal reflux disease)   . Mental disorder   . Sleep apnea    sleep study done ? 3 years ago, does not use c-pap    Patient Active Problem List   Diagnosis Date Noted  . Cigarette nicotine dependence with nicotine-induced disorder 09/01/2016  . Chronic obstructive pulmonary disease (HCC) 09/01/2016  . Morbid obesity (HCC) 08/23/2016  . DDD (degenerative disc disease), lumbosacral 08/16/2016  . Cocaine use disorder, severe, dependence (HCC) 08/13/2016  . OSA (obstructive sleep apnea) 08/13/2016  . Tobacco use disorder 08/13/2016  . Asthma 08/13/2016  . Severe recurrent major depression without psychotic features (HCC) 08/12/2016  . PTSD (post-traumatic stress disorder) 08/06/2016  . GERD (gastroesophageal reflux disease) 08/06/2016    Past Surgical History:  Procedure Laterality Date  . ABDOMINAL HYSTERECTOMY  2010  . CESAREAN SECTION     x1  . CHOLECYSTECTOMY  N/A 02/07/2013   Procedure: LAPAROSCOPIC CHOLECYSTECTOMY;  Surgeon: Axel FillerArmando Ramirez, MD;  [facility];  Service: General;  Laterality: N/A;    Current Outpatient Rx  . Order #: [id number] Class: Print  . Order #: [id number] Class: Print  . Order #: [id number] Class: Print  . Order #: [id number] Class: Normal  . Order #: [id number] Class: Print  . Order #: [id number] Class: Print  . Order #: [id number] Class: Print  . Order #: [id number] Class: Historical Med  . Order #: [id number] Class: Print    Allergies Patient has no known allergies.  Family History  Problem Relation Age of Onset  . COPD Father   . Depression Paternal Grandmother     Social History Social History  Substance Use Topics  . Smoking status: Current Every Day Smoker    Packs/day: 0.75    Years: 30.00  . Smokeless tobacco: Never Used  . Alcohol use No     Comment: never a heavy drinker- last drank april 2018    Review of Systems  Constitutional: No fever/chills Eyes: No visual changes. ENT: No sore throat. Positive runny nose and sinus congestion.  Cardiovascular: Denies chest pain. Respiratory: Denies shortness of breath. Gastrointestinal: No abdominal pain.  No nausea, no vomiting.  No diarrhea.  No constipation. Genitourinary: Negative for dysuria. Musculoskeletal: Negative for back pain. Skin: Negative for rash. Neurological: Negative for focal weakness or numbness. Positive frontal HA.   10-point ROS otherwise negative.  ____________________________________________   PHYSICAL EXAM:  VITAL SIGNS: ED Triage Vitals  Enc Vitals Group     BP 10/15/16 0850 131/83     Pulse Rate  10/15/16 0850 65     Resp 10/15/16 0850 16     Temp 10/15/16 0854 98 F (36.7 C)     Temp Source 10/15/16 0854 Oral     SpO2 10/15/16 0850 96 %     Weight 10/15/16 0855 272 lb (123.4 kg)     Height 10/15/16 0855 5\' 2"  (1.575 m)   Constitutional: Alert and oriented. Well appearing and in no acute distress. Eyes: Conjunctivae  are normal.  Head: Atraumatic. Nose: No congestion/rhinnorhea. Mouth/Throat: Mucous membranes are moist.  Oropharynx non-erythematous. Mild maxillary sinus tenderness to palpation.  Neck: No stridor.  Cardiovascular: Normal rate, regular rhythm. Good peripheral circulation. Grossly normal heart sounds.   Respiratory: Normal respiratory effort.  No retractions. Lungs CTAB. Gastrointestinal: Soft and nontender. No distention.  Musculoskeletal: No lower extremity tenderness nor edema. No gross deformities of extremities. Neurologic:  Normal speech and language. No gross focal neurologic deficits are appreciated.  Skin:  Skin is warm, dry and intact. No rash noted.  ____________________________________________   PROCEDURES  Procedure(s) performed:   Procedures  None ____________________________________________   INITIAL IMPRESSION / ASSESSMENT AND PLAN / ED COURSE  Pertinent labs & imaging results that were available during my care of the patient were reviewed by me and considered in my medical decision making (see chart for details).  Patient resents to the emergency department for evaluation of sinus congestion, difficulty breathing, or morning headache with face pain over the last 2 weeks. She is out of her albuterol inhalers. No fever or chills. No focal findings on exam. No wheezing. Patient given albuterol inhaler in the emergency department. Plan for discharge home with prescription for Z-pack with > 2 weeks of sinus symptoms, Flonase, and Claritin.   At this time, I do not feel there is any life-threatening condition present. I have reviewed and discussed all results (EKG, imaging, lab, urine as appropriate), exam findings with patient. I have reviewed nursing notes and appropriate previous records.  I feel the patient is safe to be discharged home without further emergent workup. Discussed usual and customary return precautions. Patient and family (if present) verbalize  understanding and are comfortable with this plan.  Patient will follow-up with their primary care provider. If they do not have a primary care provider, information for follow-up has been provided to them. All questions have been answered.    ____________________________________________  FINAL CLINICAL IMPRESSION(S) / ED DIAGNOSES  Final diagnoses:  Mild intermittent asthma with acute exacerbation  Sinus headache  Nasal congestion     MEDICATIONS GIVEN DURING THIS VISIT:  Medications  albuterol (PROVENTIL HFA;VENTOLIN HFA) 108 (90 Base) MCG/ACT inhaler 2 puff (2 puffs Inhalation Given 10/15/16 0929)     NEW OUTPATIENT MEDICATIONS STARTED DURING THIS VISIT:  New Prescriptions   ALBUTEROL (PROVENTIL HFA;VENTOLIN HFA) 108 (90 BASE) MCG/ACT INHALER    Inhale 1-2 puffs into the lungs every 6 (six) hours as needed for wheezing or shortness of breath.   AZITHROMYCIN (ZITHROMAX) 250 MG TABLET    Take 1 tablet (250 mg total) by mouth daily. Take first 2 tablets together, then 1 every day until finished.   FLUTICASONE (FLONASE) 50 MCG/ACT NASAL SPRAY    Place 1 spray into both nostrils daily.   LORATADINE (CLARITIN) 10 MG TABLET    Take 1 tablet (10 mg total) by mouth daily.      Note:  This document was prepared using Dragon voice recognition software and may include unintentional dictation errors.  Alona Bene, MD Emergency Medicine  Maia Plan, MD 10/15/16 [id number]

## 2016-10-15 NOTE — Discharge Instructions (Signed)
We believe that your symptoms are caused today by an exacerbation of your asthma.  Please take the prescribed medications and any medications that you have at home.  Follow up with your doctor as recommended.  If you develop any new or worsening symptoms, including but not limited to fever, persistent vomiting, worsening shortness of breath, or other symptoms that concern you, please return to the Emergency Department immediately. ° ° °Asthma °Asthma is a recurring condition in which the airways tighten and narrow. Asthma can make it difficult to breathe. It can cause coughing, wheezing, and shortness of breath. Asthma episodes, also called asthma attacks, range from minor to life-threatening. Asthma cannot be cured, but medicines and lifestyle changes can help control it. °CAUSES °Asthma is believed to be caused by inherited (genetic) and environmental factors, but its exact cause is unknown. Asthma may be triggered by allergens, lung infections, or irritants in the air. Asthma triggers are different for each person. Common triggers include:  °Animal dander. °Dust mites. °Cockroaches. °Pollen from trees or grass. °Mold. °Smoke. °Air pollutants such as dust, household cleaners, hair sprays, aerosol sprays, paint fumes, strong chemicals, or strong odors. °Cold air, weather changes, and winds (which increase molds and pollens in the air). °Strong emotional expressions such as crying or laughing hard. °Stress. °Certain medicines (such as aspirin) or types of drugs (such as beta-blockers). °Sulfites in foods and drinks. Foods and drinks that may contain sulfites include dried fruit, potato chips, and sparkling grape juice. °Infections or inflammatory conditions such as the flu, a cold, or an inflammation of the nasal membranes (rhinitis). °Gastroesophageal reflux disease (GERD). °Exercise or strenuous activity. °SYMPTOMS °Symptoms may occur immediately after asthma is triggered or many hours later. Symptoms  include: °Wheezing. °Excessive nighttime or early morning coughing. °Frequent or severe coughing with a common cold. °Chest tightness. °Shortness of breath. °DIAGNOSIS  °The diagnosis of asthma is made by a review of your medical history and a physical exam. Tests may also be performed. These may include: °Lung function studies. These tests show how much air you breathe in and out. °Allergy tests. °Imaging tests such as X-rays. °TREATMENT  °Asthma cannot be cured, but it can usually be controlled. Treatment involves identifying and avoiding your asthma triggers. It also involves medicines. There are 2 classes of medicine used for asthma treatment:  °Controller medicines. These prevent asthma symptoms from occurring. They are usually taken every day. °Reliever or rescue medicines. These quickly relieve asthma symptoms. They are used as needed and provide short-term relief. °Your health care provider will help you create an asthma action plan. An asthma action plan is a written plan for managing and treating your asthma attacks. It includes a list of your asthma triggers and how they may be avoided. It also includes information on when medicines should be taken and when their dosage should be changed. An action plan may also involve the use of a device called a peak flow meter. A peak flow meter measures how well the lungs are working. It helps you monitor your condition. °HOME CARE INSTRUCTIONS  °Take medicines only as directed by your health care provider. Speak with your health care provider if you have questions about how or when to take the medicines. °Use a peak flow meter as directed by your health care provider. Record and keep track of readings. °Understand and use the action plan to help minimize or stop an asthma attack without needing to seek medical care. °Control your home environment in the following   ways to help prevent asthma attacks: °Do not smoke. Avoid being exposed to secondhand smoke. °Change  your heating and air conditioning filter regularly. °Limit your use of fireplaces and wood stoves. °Get rid of pests (such as roaches and mice) and their droppings. °Throw away plants if you see mold on them. °Clean your floors and dust regularly. Use unscented cleaning products. °Try to have someone else vacuum for you regularly. Stay out of rooms while they are being vacuumed and for a short while afterward. If you vacuum, use a dust mask from a hardware store, a double-layered or microfilter vacuum cleaner bag, or a vacuum cleaner with a HEPA filter. °Replace carpet with wood, tile, or vinyl flooring. Carpet can trap dander and dust. °Use allergy-proof pillows, mattress covers, and box spring covers. °Wash bed sheets and blankets every week in hot water and dry them in a dryer. °Use blankets that are made of polyester or cotton. °Clean bathrooms and kitchens with bleach. If possible, have someone repaint the walls in these rooms with mold-resistant paint. Keep out of the rooms that are being cleaned and painted. °Wash hands frequently. °SEEK MEDICAL CARE IF:  °You have wheezing, shortness of breath, or a cough even if taking medicine to prevent attacks. °The colored mucus you cough up (sputum) is thicker than usual. °Your sputum changes from clear or white to yellow, green, gray, or bloody. °You have any problems that may be related to the medicines you are taking (such as a rash, itching, swelling, or trouble breathing). °You are using a reliever medicine more than 2-3 times per week. °Your peak flow is still at 50-79% of your personal best after following your action plan for 1 hour. °You have a fever. °SEEK IMMEDIATE MEDICAL CARE IF:  °You seem to be getting worse and are unresponsive to treatment during an asthma attack. °You are short of breath even at rest. °You get short of breath when doing very little physical activity. °You have difficulty eating, drinking, or talking due to asthma symptoms. °You  develop chest pain. °You develop a fast heartbeat. °You have a bluish color to your lips or fingernails. °You are light-headed, dizzy, or faint. °Your peak flow is less than 50% of your personal best. °MAKE SURE YOU:  °Understand these instructions. °Will watch your condition. °Will get help right away if you are not doing well or get worse. °Document Released: 04/19/2005 Document Revised: 09/03/2013 Document Reviewed: 11/16/2012 °[employer]® Patient Information ©2015 [employer]. This information is not intended to replace advice given to you by your health care provider. Make sure you discuss any questions you have with your health care provider. ° °How to Use a Nebulizer °If you have asthma or other breathing problems, you might need to breathe in (inhale) medicine. This can be done with a nebulizer. A nebulizer is a device that turns liquid medicine into a mist that you can inhale.  °There are different kinds of nebulizers. Most are small. With some, you breathe in through a mouthpiece. With others, a mask fits over your nose and mouth. Most nebulizers must be connected to a small air compressor. Air is forced through tubing from the compressor to the nebulizer. The forced air changes the liquid into a fine spray. °RISKS AND COMPLICATIONS °The nebulizer must work properly for it to help your breathing. If the nebulizer does not produce mist, or if foam comes out, this indicates that the nebulizer is not working properly. Sometimes a filter can get clogged, or   there might be a problem with the air compressor. Check the instruction booklet that came with your nebulizer. It should tell you how to fix problems or where to call for help. You should have at least one extra nebulizer at home. That way, you will always have one when you need it.  °HOW TO PREPARE BEFORE USING THE NEBULIZER °Take these steps before using the nebulizer: °Check your medicine. Make sure it has not expired and is not damaged in any way.    °Wash your hands with soap and water.   °Put all the parts of your nebulizer on a sturdy, flat surface. Make sure the tubing connects the compressor and the nebulizer. °Measure the liquid medicine according to your health care provider's instructions. Pour it into the nebulizer. °Attach the mouthpiece or mask.   °Test the nebulizer by turning it on to make sure a spray is coming out. Then, turn it off.   °HOW TO USE THE NEBULIZER °Sit down and focus on staying relaxed.   °If your nebulizer has a mask, put it over your nose and mouth. If you use a mouthpiece, put it in your mouth. Press your lips firmly around the mouthpiece. °Turn on the nebulizer.   °Breathe out.   °Some nebulizers have a finger valve. If yours does, cover up the air hole so the air gets to the nebulizer. °Once the medicine begins to mist out, take slow, deep breaths. If there is a finger valve, release it at the end of your breath. °Continue taking slow, deep breaths until the nebulizer is empty.   °Be sure to stop the machine at any point if you start coughing or if the medicine foams or bubbles. °HOW TO CLEAN THE NEBULIZER  °The nebulizer and all its parts must be kept very clean. Follow the manufacturer's instructions for cleaning. For most nebulizers, you should follow these guidelines: °Wash the nebulizer after each use. Use warm water and soap. Rinse it well. Shake the nebulizer to remove extra water. Put it on a clean towel until it is completely dry. To make sure it is dry, put the nebulizer back together. Turn on the compressor for a few minutes. This will blow air through the nebulizer.   °Do not wash the tubing or the finger valve.   °Store the nebulizer in a dust-free place.   °Inspect the filter every week. Replace it any time it looks dirty.   °Sometimes the nebulizer will need a more complete cleaning. The instruction booklet should say how often you need to do this. °SEEK MEDICAL CARE IF:  °You continue to have difficulty  breathing.   °You have trouble using the nebulizer.   °Document Released: 04/07/2009 Document Revised: 09/03/2013 Document Reviewed: 10/09/2012 °[employer]® Patient Information ©2015 [employer]. This information is not intended to replace advice given to you by your health care provider. Make sure you discuss any questions you have with your health care provider. ° °How to Use an Inhaler °Proper inhaler technique is very important. Good technique ensures that the medicine reaches the lungs. Poor technique results in depositing the medicine on the tongue and back of the throat rather than in the airways. If you do not use the inhaler with good technique, the medicine will not help you. °STEPS TO FOLLOW IF USING AN INHALER WITHOUT AN EXTENSION TUBE °Remove the cap from the inhaler. °If you are using the inhaler for the first time, you will need to prime it. Shake the inhaler for   5 seconds and release four puffs into the air, away from your face. Ask your health care provider or pharmacist if you have questions about priming your inhaler. °Shake the inhaler for 5 seconds before each breath in (inhalation). °Position the inhaler so that the top of the canister faces up. °Put your index finger on the top of the medicine canister. Your thumb supports the bottom of the inhaler. °Open your mouth. °Either place the inhaler between your teeth and place your lips tightly around the mouthpiece, or hold the inhaler 1-2 inches away from your open mouth. If you are unsure of which technique to use, ask your health care provider. °Breathe out (exhale) normally and as completely as possible. °Press the canister down with your index finger to release the medicine. °At the same time as the canister is pressed, inhale deeply and slowly until your lungs are completely filled. This should take 4-6 seconds. Keep your tongue down. °Hold the medicine in your lungs for 5-10 seconds (10 seconds is best). This helps the medicine get into the  small airways of your lungs. °Breathe out slowly, through pursed lips. Whistling is an example of pursed lips. °Wait at least 15-30 seconds between puffs. Continue with the above steps until you have taken the number of puffs your health care provider has ordered. Do not use the inhaler more than your health care provider tells you. °Replace the cap on the inhaler. °Follow the directions from your health care provider or the inhaler insert for cleaning the inhaler. °STEPS TO FOLLOW IF USING AN INHALER WITH AN EXTENSION (SPACER) °Remove the cap from the inhaler. °If you are using the inhaler for the first time, you will need to prime it. Shake the inhaler for 5 seconds and release four puffs into the air, away from your face. Ask your health care provider or pharmacist if you have questions about priming your inhaler. °Shake the inhaler for 5 seconds before each breath in (inhalation). °Place the open end of the spacer onto the mouthpiece of the inhaler. °Position the inhaler so that the top of the canister faces up and the spacer mouthpiece faces you. °Put your index finger on the top of the medicine canister. Your thumb supports the bottom of the inhaler and the spacer. °Breathe out (exhale) normally and as completely as possible. °Immediately after exhaling, place the spacer between your teeth and into your mouth. Close your lips tightly around the spacer. °Press the canister down with your index finger to release the medicine. °At the same time as the canister is pressed, inhale deeply and slowly until your lungs are completely filled. This should take 4-6 seconds. Keep your tongue down and out of the way. °Hold the medicine in your lungs for 5-10 seconds (10 seconds is best). This helps the medicine get into the small airways of your lungs. Exhale. °Repeat inhaling deeply through the spacer mouthpiece. Again hold that breath for up to 10 seconds (10 seconds is best). Exhale slowly. If it is difficult to take  this second deep breath through the spacer, breathe normally several times through the spacer. Remove the spacer from your mouth. °Wait at least 15-30 seconds between puffs. Continue with the above steps until you have taken the number of puffs your health care provider has ordered. Do not use the inhaler more than your health care provider tells you. °Remove the spacer from the inhaler, and place the cap on the inhaler. °Follow the directions from your health care provider   or the inhaler insert for cleaning the inhaler and spacer. °If you are using different kinds of inhalers, use your quick relief medicine to open the airways 10-15 minutes before using a steroid if instructed to do so by your health care provider. If you are unsure which inhalers to use and the order of using them, ask your health care provider, nurse, or respiratory therapist. °If you are using a steroid inhaler, always rinse your mouth with water after your last puff, then gargle and spit out the water. Do not swallow the water. °AVOID: °Inhaling before or after starting the spray of medicine. It takes practice to coordinate your breathing with triggering the spray. °Inhaling through the nose (rather than the mouth) when triggering the spray. °HOW TO DETERMINE IF YOUR INHALER IS FULL OR NEARLY EMPTY °You cannot know when an inhaler is empty by shaking it. A few inhalers are now being made with dose counters. Ask your health care provider for a prescription that has a dose counter if you feel you need that extra help. If your inhaler does not have a counter, ask your health care provider to help you determine the date you need to refill your inhaler. Write the refill date on a calendar or your inhaler canister. Refill your inhaler 7-10 days before it runs out. Be sure to keep an adequate supply of medicine. This includes making sure it is not expired, and that you have a spare inhaler.  °SEEK MEDICAL CARE IF:  °Your symptoms are only partially  relieved with your inhaler. °You are having trouble using your inhaler. °You have some increase in phlegm. °SEEK IMMEDIATE MEDICAL CARE IF:  °You feel little or no relief with your inhalers. You are still wheezing and are feeling shortness of breath or tightness in your chest or both. °You have dizziness, headaches, or a fast heart rate. °You have chills, fever, or night sweats. °You have a noticeable increase in phlegm production, or there is blood in the phlegm. °MAKE SURE YOU:  °Understand these instructions. °Will watch your condition. °Will get help right away if you are not doing well or get worse. °Document Released: 04/16/2000 Document Revised: 02/07/2013 Document Reviewed: 11/16/2012 °[employer]® Patient Information ©2015 [employer]. This information is not intended to replace advice given to you by your health care provider. Make sure you discuss any questions you have with your health care provider. ° ° ° °

## 2016-11-17 ENCOUNTER — Emergency Department
Admission: EM | Admit: 2016-11-17 | Discharge: 2016-11-17 | Disposition: A | Discharge status: Home / Self Care | Payer: Self-pay | Attending: Emergency Medicine | Admitting: Emergency Medicine

## 2016-11-17 ENCOUNTER — Emergency Department: Payer: Self-pay

## 2016-11-17 ENCOUNTER — Encounter: Payer: Self-pay | Admitting: Emergency Medicine

## 2016-11-17 DIAGNOSIS — S1093XA Contusion of unspecified part of neck, initial encounter: Secondary | ICD-10-CM | POA: Insufficient documentation

## 2016-11-17 DIAGNOSIS — S0083XA Contusion of other part of head, initial encounter: Secondary | ICD-10-CM | POA: Insufficient documentation

## 2016-11-17 DIAGNOSIS — Y999 Unspecified external cause status: Secondary | ICD-10-CM | POA: Insufficient documentation

## 2016-11-17 DIAGNOSIS — J449 Chronic obstructive pulmonary disease, unspecified: Secondary | ICD-10-CM | POA: Insufficient documentation

## 2016-11-17 DIAGNOSIS — Z79899 Other long term (current) drug therapy: Secondary | ICD-10-CM | POA: Insufficient documentation

## 2016-11-17 DIAGNOSIS — Y929 Unspecified place or not applicable: Secondary | ICD-10-CM | POA: Insufficient documentation

## 2016-11-17 DIAGNOSIS — J45909 Unspecified asthma, uncomplicated: Secondary | ICD-10-CM | POA: Insufficient documentation

## 2016-11-17 DIAGNOSIS — S0990XA Unspecified injury of head, initial encounter: Secondary | ICD-10-CM

## 2016-11-17 DIAGNOSIS — F17210 Nicotine dependence, cigarettes, uncomplicated: Secondary | ICD-10-CM | POA: Insufficient documentation

## 2016-11-17 DIAGNOSIS — Y939 Activity, unspecified: Secondary | ICD-10-CM | POA: Insufficient documentation

## 2016-11-17 LAB — BASIC METABOLIC PANEL
Anion gap: 10 (ref 5–15)
BUN: 11 mg/dL (ref 6–20)
CALCIUM: 9.5 mg/dL (ref 8.9–10.3)
CO2: 29 mmol/L (ref 22–32)
CREATININE: 0.72 mg/dL (ref 0.44–1.00)
Chloride: 105 mmol/L (ref 101–111)
GFR calc non Af Amer: >60 mL/min (ref >60)
Glucose, Bld: 142 mg/dL — ABNORMAL HIGH (ref 65–99)
Potassium: 3.1 mmol/L — ABNORMAL LOW (ref 3.5–5.1)
SODIUM: 144 mmol/L (ref 135–145)

## 2016-11-17 LAB — CBC
HCT: 38.6 % (ref 35.0–47.0)
Hemoglobin: 12.9 g/dL (ref 12.0–16.0)
MCH: 29.2 pg (ref 26.0–34.0)
MCHC: 33.6 g/dL (ref 32.0–36.0)
MCV: 87 fL (ref 80.0–100.0)
Platelets: 329 10*3/uL (ref 150–440)
RBC: 4.43 MIL/uL (ref 3.80–5.20)
RDW: 14.2 % (ref 11.5–14.5)
WBC: 13.1 10*3/uL — ABNORMAL HIGH (ref 3.6–11.0)

## 2016-11-17 MED ORDER — IOPAMIDOL (ISOVUE-370) INJECTION 76%
75.0000 mL | Freq: Once | INTRAVENOUS | Status: AC | PRN
  Administered 2016-11-17: 75 mL via INTRAVENOUS
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
## 2016-11-17 NOTE — ED Notes (Signed)
This RN discussed patient with Dr. Sharma CovertNorman, received orders from Dr. Sharma CovertNorman at this time.
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
## 2016-12-06 ENCOUNTER — Encounter (HOSPITAL_COMMUNITY): Payer: Self-pay | Admitting: Emergency Medicine

## 2016-12-06 ENCOUNTER — Emergency Department (HOSPITAL_COMMUNITY)
Admission: EM | Admit: 2016-12-06 | Discharge: 2016-12-06 | Discharge status: Against Medical Advice | Payer: Self-pay | Attending: Emergency Medicine | Admitting: Emergency Medicine

## 2016-12-06 DIAGNOSIS — Z5321 Procedure and treatment not carried out due to patient leaving prior to being seen by health care provider: Secondary | ICD-10-CM | POA: Insufficient documentation

## 2016-12-06 NOTE — ED Triage Notes (Signed)
Pt c/o severe left foot pain and "knot" to left foot onset 3 days ago. No acute injury, pt states she is constantly on her feet for work.

## 2016-12-08 ENCOUNTER — Emergency Department (HOSPITAL_COMMUNITY)
Admission: EM | Admit: 2016-12-08 | Discharge: 2016-12-08 | Disposition: A | Discharge status: Home / Self Care | Payer: Self-pay | Attending: Emergency Medicine | Admitting: Emergency Medicine

## 2016-12-08 ENCOUNTER — Encounter (HOSPITAL_COMMUNITY): Payer: Self-pay | Admitting: Emergency Medicine

## 2016-12-08 DIAGNOSIS — M79672 Pain in left foot: Secondary | ICD-10-CM | POA: Insufficient documentation

## 2016-12-08 DIAGNOSIS — J45909 Unspecified asthma, uncomplicated: Secondary | ICD-10-CM | POA: Insufficient documentation

## 2016-12-08 DIAGNOSIS — R202 Paresthesia of skin: Secondary | ICD-10-CM | POA: Insufficient documentation

## 2016-12-08 DIAGNOSIS — Z79899 Other long term (current) drug therapy: Secondary | ICD-10-CM | POA: Insufficient documentation

## 2016-12-08 DIAGNOSIS — F17210 Nicotine dependence, cigarettes, uncomplicated: Secondary | ICD-10-CM | POA: Insufficient documentation

## 2016-12-08 MED ORDER — MELOXICAM 7.5 MG PO TABS: 7.5000 mg | ORAL_TABLET | Freq: Every day | ORAL | 0 refills | Status: DC
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
## 2016-12-08 NOTE — ED Provider Notes (Signed)
[facility]-EMERGENCY DEPT Provider Note   CSN: [id number] Arrival date & time: 12/08/16  1309     History   Chief Complaint Chief Complaint  Patient presents with  . Foot Pain  . body numbness    HPI Tracie Hodge is a 41 y.o. female presenting with three-day history of left foot pain.  Patient states that for the past 3 days, she has had pain in the bottom of her foot at the site of a bump. This pain is worse at the end of the day after she's been on her feet all day. She has no pain when she is at rest or when she first wakes up in the morning. She has tried Excedrin without relief. She states that she came to the emergency department several days ago to try and be seen, however left heart being seen due to long waits. She denies numbness or tingling of the foot. Pain is worse with movement of the ankle, but no pain with movement of the toes. She has no pain elsewhere, and has not had pain in her foot like this prior.  Additionally, patient reports she took some medicine from her roommates Aleve bottle this morning, and shortly after, felt out of body and sweaty and numb. She states that it took about 3 hours before she felt she was able to drive to the emergency room. She currently is denying any of the symptoms, stating that they resolved on their own. She states neither she nor her roommate know what was in that bottle. She denies vision changes, chest pain, shortness of breath, nausea, vomiting, abdominal pain, urinary symptoms, or abnormal bowel movements. She states at this time, she does not want to be worked up before this incident, as all of her symptoms have improved. She only wants to be seen regarding her foot pain at this time. Patient repeatedly asked for work note.  HPI  Past Medical History:  Diagnosis Date  . Anxiety   . Asthma   . Depression   . GERD (gastroesophageal reflux disease)   . Mental disorder   . Sleep apnea    sleep study done ? 3 years ago, does not use  c-pap    Patient Active Problem List   Diagnosis Date Noted  . Cigarette nicotine dependence with nicotine-induced disorder 09/01/2016  . Chronic obstructive pulmonary disease (HCC) 09/01/2016  . Morbid obesity (HCC) 08/23/2016  . DDD (degenerative disc disease), lumbosacral 08/16/2016  . Cocaine use disorder, severe, dependence (HCC) 08/13/2016  . OSA (obstructive sleep apnea) 08/13/2016  . Tobacco use disorder 08/13/2016  . Asthma 08/13/2016  . Severe recurrent major depression without psychotic features (HCC) 08/12/2016  . PTSD (post-traumatic stress disorder) 08/06/2016  . GERD (gastroesophageal reflux disease) 08/06/2016    Past Surgical History:  Procedure Laterality Date  . ABDOMINAL HYSTERECTOMY  2010  . CESAREAN SECTION     x1  . CHOLECYSTECTOMY N/A 02/07/2013   Procedure: LAPAROSCOPIC CHOLECYSTECTOMY;  Surgeon: Axel FillerArmando Ramirez, MD;  Location: [facility] OR;  Service: General;  Laterality: N/A;    OB History    No data available       Home Medications    Prior to Admission medications   Medication Sig Start Date End Date Taking? Authorizing Provider  albuterol (PROVENTIL HFA;VENTOLIN HFA) 108 (90 Base) MCG/ACT inhaler Inhale 1-2 puffs into the lungs every 6 (six) hours as needed for wheezing or shortness of breath. 10/15/16   Long, Arlyss RepressJoshua G, MD  azithromycin (ZITHROMAX) 250 MG tablet  Take 1 tablet (250 mg total) by mouth daily. Take first 2 tablets together, then 1 every day until finished. 10/15/16   Long, Arlyss Repress, MD  benzonatate (TESSALON) 100 MG capsule 1-2 po q 8 hour prn cough Patient not taking: Reported on 10/11/2016 09/28/16   Jacquelin Hawking, PA-C  diclofenac (VOLTAREN) 75 MG EC tablet Take 1 tablet (75 mg total) by mouth 2 (two) times daily as needed. 10/11/16   Jacquelin Hawking, PA-C  FLUoxetine (PROZAC) 20 MG capsule Take 1 capsule (20 mg total) by mouth daily. 08/18/16   Jimmy Footman, MD  fluticasone (FLONASE) 50 MCG/ACT nasal spray Place 1 spray  into both nostrils daily. 10/15/16 11/14/16  Long, Arlyss Repress, MD  loratadine (CLARITIN) 10 MG tablet Take 1 tablet (10 mg total) by mouth daily. 10/15/16 11/14/16  Long, Arlyss Repress, MD  meloxicam (MOBIC) 7.5 MG tablet Take 1 tablet (7.5 mg total) by mouth daily. 12/08/16   Mikail Goostree, PA-C  omeprazole (PRILOSEC) 20 MG capsule Take 20 mg by mouth daily.    [provider]  traZODone (DESYREL) 100 MG tablet Take 1 tablet (100 mg total) by mouth at bedtime. Patient taking differently: Take 200 mg by mouth at bedtime.  08/17/16   Jimmy Footman, MD    Family History Family History  Problem Relation Age of Onset  . COPD Father   . Depression Paternal Grandmother     Social History Social History  Substance Use Topics  . Smoking status: Current Every Day Smoker    Packs/day: 0.75    Years: 30.00    Types: Cigarettes  . Smokeless tobacco: Never Used  . Alcohol use No     Comment: never a heavy drinker- last drank april 2018     Allergies   Patient has no known allergies.   Review of Systems Review of Systems  Constitutional: Negative for chills and fever.  HENT: Negative for congestion and sore throat.   Respiratory: Negative for cough, chest tightness and shortness of breath.   Cardiovascular: Negative for chest pain and palpitations.  Gastrointestinal: Negative for abdominal pain, constipation, diarrhea, nausea and vomiting.  Genitourinary: Negative for dysuria, frequency and hematuria.  Musculoskeletal: Negative for joint swelling, myalgias and neck pain.       L foot pain  Skin: Negative for wound.  Neurological: Negative for dizziness, light-headedness and numbness.  Hematological: Does not bruise/bleed easily.  Psychiatric/Behavioral: Negative for agitation and confusion.     Physical Exam Updated Vital Signs BP (!) 122/51   Pulse 77   Temp 97.9 F (36.6 C) (Oral)   Resp 16   LMP  (LMP Unknown)   SpO2 98%   Physical Exam  Constitutional:  She is oriented to person, place, and time. She appears well-developed and well-nourished. No distress.  HENT:  Head: Normocephalic and atraumatic.  Nose: Nose normal.  Mouth/Throat: Uvula is midline, oropharynx is clear and moist and mucous membranes are normal.  Eyes: Pupils are equal, round, and reactive to light. Conjunctivae and EOM are normal.  Neck: Normal range of motion.  Cardiovascular: Normal rate, regular rhythm and intact distal pulses.   Pulmonary/Chest: Effort normal and breath sounds normal. No respiratory distress. She has no wheezes. She has no rales.  Abdominal: Soft. She exhibits no distension. There is no tenderness.  Musculoskeletal: Normal range of motion.  Lesion felt on the plantar aspect of the left foot at the base of the first metatarsal. Area is approximately half a centimeter in diameter, slightly tender,  and non-mobile. No swelling or erythema of the skin. No obvious injury or laceration. Compartment soft. Full range of motion of the toes without pain. Full range of motion of the ankle with minimal discomfort. No pain elsewhere on the foot. Patient is ambulatory. Strength intact 4. Color and warmth equal 4. Sensation intact 4. Pedal and radial pulses equal bilaterally.   Lymphadenopathy:    She has no cervical adenopathy.  Neurological: She is alert and oriented to person, place, and time. She has normal strength and normal reflexes. No cranial nerve deficit or sensory deficit. She displays a negative Romberg sign. GCS eye subscore is 4. GCS verbal subscore is 5. GCS motor subscore is 6.  Fine movement and coordination intact.  Skin: Skin is warm. No rash noted.  Psychiatric: She has a normal mood and affect.  Nursing note and vitals reviewed.    ED Treatments / Results  Labs (all labs ordered are listed, but only abnormal results are displayed) Labs Reviewed - No data to display  EKG  EKG Interpretation None       Radiology No results  found.  Procedures Procedures (including critical care time)  Medications Ordered in ED Medications - No data to display   Initial Impression / Assessment and Plan / ED Course  I have reviewed the triage vital signs and the nursing notes.  Pertinent labs & imaging results that were available during my care of the patient were reviewed by me and considered in my medical decision making (see chart for details).     Patient presenting with several days pain of the left foot. Lesion palpated, and no sign of injury, swelling, or infection. Neurovascularly intact with soft compartments. Full range of motion of toes and ankle. Doubt bony abnormality or pathology, likely soft tissue swelling. The patient does not want to be worked up for her neurologic symptoms earlier today. Physical exam reassuring, as patient has no known neurologic deficits. Discussed with patient, and she states she would just like help with her foot pain. At this time, patient appears safe for discharge. Discussing conservative treatments. Follow-up with podiatry. Return precautions given. Patient states she understands and agrees to plan.  Final Clinical Impressions(s) / ED Diagnoses   Final diagnoses:  Foot pain, left    New Prescriptions Discharge Medication List as of 12/08/2016  7:52 PM    START taking these medications   Details  meloxicam (MOBIC) 7.5 MG tablet Take 1 tablet (7.5 mg total) by mouth daily., Starting Wed 12/08/2016, Print         [place], [place], PA-C 12/08/16 2023    Linwood Dibbles, MD 12/09/16 [phone]

## 2016-12-08 NOTE — Discharge Instructions (Signed)
Take Mobic once a day. Do not take other anti-inflammatories at the same time (Advil, Motrin, naproxen, Aleve, ibuprofen). You may supplement with Tylenol as needed. Try to rest your foot as much as possible. Wear shoes with good support and thick soles. Follow-up with the foot doctor for further evaluation of your foot. Return to the emergency department if you develop numbness, tingling, or any new or worsening symptoms.

## 2016-12-09 ENCOUNTER — Ambulatory Visit: Payer: Self-pay | Admitting: Physician Assistant

## 2016-12-16 ENCOUNTER — Encounter: Payer: Self-pay | Admitting: Physician Assistant

## 2017-03-28 ENCOUNTER — Emergency Department
Admission: EM | Admit: 2017-03-28 | Discharge: 2017-03-28 | Disposition: A | Discharge status: Home / Self Care | Payer: Self-pay | Attending: Student in an Organized Health Care Education/Training Program | Admitting: Student in an Organized Health Care Education/Training Program

## 2017-03-28 ENCOUNTER — Encounter: Payer: Self-pay | Admitting: Emergency Medicine

## 2017-03-28 ENCOUNTER — Emergency Department: Payer: Self-pay

## 2017-03-28 DIAGNOSIS — Z760 Encounter for issue of repeat prescription: Secondary | ICD-10-CM | POA: Insufficient documentation

## 2017-03-28 DIAGNOSIS — F17210 Nicotine dependence, cigarettes, uncomplicated: Secondary | ICD-10-CM | POA: Insufficient documentation

## 2017-03-28 DIAGNOSIS — B349 Viral infection, unspecified: Secondary | ICD-10-CM | POA: Insufficient documentation

## 2017-03-28 DIAGNOSIS — Z79899 Other long term (current) drug therapy: Secondary | ICD-10-CM | POA: Insufficient documentation

## 2017-03-28 DIAGNOSIS — J449 Chronic obstructive pulmonary disease, unspecified: Secondary | ICD-10-CM | POA: Insufficient documentation

## 2017-03-28 DIAGNOSIS — J45909 Unspecified asthma, uncomplicated: Secondary | ICD-10-CM | POA: Insufficient documentation

## 2017-03-28 MED ORDER — ALBUTEROL SULFATE HFA 108 (90 BASE) MCG/ACT IN AERS: 1.0000 | INHALATION_SPRAY | Freq: Four times a day (QID) | RESPIRATORY_TRACT | 0 refills | Status: DC | PRN

## 2017-03-28 NOTE — Discharge Instructions (Signed)
Follow-up with one of the clinics listed on your discharge papers or [facility] if any continued problems. Tylenol or ibuprofen as needed for body aches or fever. Increase fluids. Continue nebulizer treatments at home. Albuterol inhaler as needed for wheezing as directed.

## 2017-03-28 NOTE — ED Provider Notes (Signed)
[facility] Emergency Department Provider Note  ____________________________________________   First MD Initiated Contact with Patient 03/28/17 1249     (approximate)  I have reviewed the triage vital signs and the nursing notes.   HISTORY  Chief Complaint Shortness of Breath   HPI Tracie Hodge is a 41 y.o. female this is a complaint of shortness of breath. Patient states that she has history of asthma and has been out of her rescue inhaler for several months. She states she's been using her albuterol nebulizer treatments at home. She states that she has had a lot of congestion recently which is making her asthma worse. She denies any fever or chills. She has had no difficulty with shortness of breath on exertion. Patient states that she has continued smoking. She also needs a note because she did not go to work today.she rates her discomfort as 6 out of 10.   Past Medical History:  Diagnosis Date  . Anxiety   . Asthma   . Depression   . GERD (gastroesophageal reflux disease)   . Mental disorder   . Sleep apnea    sleep study done ? 3 years ago, does not use c-pap    Patient Active Problem List   Diagnosis Date Noted  . Cigarette nicotine dependence with nicotine-induced disorder 09/01/2016  . Chronic obstructive pulmonary disease (HCC) 09/01/2016  . Morbid obesity (HCC) 08/23/2016  . DDD (degenerative disc disease), lumbosacral 08/16/2016  . Cocaine use disorder, severe, dependence (HCC) 08/13/2016  . OSA (obstructive sleep apnea) 08/13/2016  . Tobacco use disorder 08/13/2016  . Asthma 08/13/2016  . Severe recurrent major depression without psychotic features (HCC) 08/12/2016  . PTSD (post-traumatic stress disorder) 08/06/2016  . GERD (gastroesophageal reflux disease) 08/06/2016    Past Surgical History:  Procedure Laterality Date  . ABDOMINAL HYSTERECTOMY  2010  . CESAREAN SECTION     x1  . CHOLECYSTECTOMY N/A 02/07/2013   Procedure:  LAPAROSCOPIC CHOLECYSTECTOMY;  Surgeon: Axel Filler, MD;  Location: [facility] OR;  Service: General;  Laterality: N/A;    Prior to Admission medications   Medication Sig Start Date End Date Taking? Authorizing Provider  albuterol (PROVENTIL HFA;VENTOLIN HFA) 108 (90 Base) MCG/ACT inhaler Inhale 1-2 puffs into the lungs every 6 (six) hours as needed for wheezing or shortness of breath. 03/28/17   Tommi Rumps, PA-C  diclofenac (VOLTAREN) 75 MG EC tablet Take 1 tablet (75 mg total) by mouth 2 (two) times daily as needed. 10/11/16   Jacquelin Hawking, PA-C  FLUoxetine (PROZAC) 20 MG capsule Take 1 capsule (20 mg total) by mouth daily. 08/18/16   Jimmy Footman, MD  fluticasone (FLONASE) 50 MCG/ACT nasal spray Place 1 spray into both nostrils daily. 10/15/16 11/14/16  Long, Arlyss Repress, MD  loratadine (CLARITIN) 10 MG tablet Take 1 tablet (10 mg total) by mouth daily. 10/15/16 11/14/16  Long, Arlyss Repress, MD  meloxicam (MOBIC) 7.5 MG tablet Take 1 tablet (7.5 mg total) by mouth daily. 12/08/16   Caccavale, Sophia, PA-C  omeprazole (PRILOSEC) 20 MG capsule Take 20 mg by mouth daily.    [provider]  traZODone (DESYREL) 100 MG tablet Take 1 tablet (100 mg total) by mouth at bedtime. Patient taking differently: Take 200 mg by mouth at bedtime.  08/17/16   Jimmy Footman, MD    Allergies Patient has no known allergies.  Family History  Problem Relation Age of Onset  . COPD Father   . Depression Paternal Grandmother  Social History Social History   Tobacco Use  . Smoking status: Current Every Day Smoker    Packs/day: 0.75    Years: 30.00    Pack years: 22.50    Types: Cigarettes  . Smokeless tobacco: Never Used  Substance Use Topics  . Alcohol use: No    Comment: never a heavy drinker- last drank april 2018  . Drug use: Yes    Types: Cocaine, "Crack" cocaine, Marijuana    Comment: last use 08-11-16    Review of Systems Constitutional: No fever/chills Eyes:  No visual changes. ENT: positive nasal congestion. Cardiovascular: Denies chest pain. Respiratory: positive for shortness of breath. Positive for wheezing. Positive for history of asthma. Gastrointestinal:   No nausea, no vomiting.  Musculoskeletal: Negative for back pain. Skin: Negative for rash. Neurological: Negative for headaches, focal weakness or numbness. ___________________________________________   PHYSICAL EXAM:  VITAL SIGNS: ED Triage Vitals  Enc Vitals Group     BP 03/28/17 1234 (!) 125/52     Pulse Rate 03/28/17 1234 74     Resp 03/28/17 1234 18     Temp 03/28/17 1234 98 F (36.7 C)     Temp Source 03/28/17 1234 Oral     SpO2 03/28/17 1234 96 %     Weight 03/28/17 1232 250 lb (113.4 kg)     Height 03/28/17 1232 5\' 3"  (1.6 m)     Head Circumference --      Peak Flow --      Pain Score 03/28/17 1232 6     Pain Loc --      Pain Edu? --      Excl. in GC? --    Constitutional: Alert and oriented. Well appearing and in no acute distress. Eyes: Conjunctivae are normal.  Head: Atraumatic. Nose: positive congestion/rhinnorhea.  Negative for pain. Mouth/Throat: Mucous membranes are moist.  Oropharynx non-erythematous. Positive posterior drainage. Neck: No stridor.   Hematological/Lymphatic/Immunilogical: No cervical lymphadenopathy. Cardiovascular: Normal rate, regular rhythm. Grossly normal heart sounds.  Good peripheral circulation. Respiratory: Normal respiratory effort.  No retractions. Lungs bilateral expiratory wheeze with slightly congested cough. Patient is able talk in complete sentences without any difficulty. Patient is ambulatory without any shortness of breath. Gastrointestinal: Soft and nontender. No distention. Musculoskeletal: moves upper and lower extremities without difficulty. Normal gait was noted. Neurologic:  Normal speech and language. No gross focal neurologic deficits are appreciated.  Skin:  Skin is warm, dry and intact.  Psychiatric: Mood and  affect are normal. Speech and behavior are normal.  ____________________________________________   LABS (all labs ordered are listed, but only abnormal results are displayed)  Labs Reviewed - No data to display ____________________________________________  EKG  EKG shows sinus rhythm with frequent PVCs. Ventricular rate of 79. PR interval 174, QRS duration 98. EKG was also reviewed by Dr. Roxan Hockeyobinson. ____________________________________________  RADIOLOGY  Deferred. ____________________________________________   PROCEDURES  Procedure(s) performed: None  Procedures  Critical Care performed: No  ____________________________________________   INITIAL IMPRESSION / ASSESSMENT AND PLAN / ED COURSE Patient is here with history of asthma and is requesting an albuterol inhaler be given to her. She is not used her inhaler in several months but feels that she needs it during the day as she uses her nebulizer machine at night. Patient also needs a work note as she did not go to work today. She denies any exacerbation of her asthma and states this is her baseline.  Patient was given a prescription for albuterol inhaler. She may also  continue using her nebulizer machine at night. She is encouraged to follow-up with a PCP. A list of clinics was also given to her to establish care. ____________________________________________   FINAL CLINICAL IMPRESSION(S) / ED DIAGNOSES  Final diagnoses:  Viral illness  Mild asthma without complication, unspecified whether persistent  Encounter for medication refill     ED Discharge Orders        Ordered    albuterol (PROVENTIL HFA;VENTOLIN HFA) 108 (90 Base) MCG/ACT inhaler  Every 6 hours PRN     03/28/17 1259       Note:  This document was prepared using Dragon voice recognition software and may include unintentional dictation errors.    Tommi RumpsSummers, Terrie Grajales L, PA-C 03/28/17 1546    Willy Eddyobinson, Patrick, MD 03/28/17 [phone]

## 2017-03-28 NOTE — ED Triage Notes (Signed)
Pt comes into the ED via POV c/o shortness of breath.  Patient has h/o asthma and states she is out of her rescue inhaler.  Patient states she has a lot of congestion that has made it worse.  Pateint in NAD at this time with even and unlabored respirations and was ambulatory to triage at this time.  Patient states she will also need a work note.  No audible wheezing present at this time.

## 2017-05-01 ENCOUNTER — Encounter (HOSPITAL_COMMUNITY): Payer: Self-pay

## 2017-05-01 ENCOUNTER — Emergency Department (HOSPITAL_COMMUNITY)
Admission: EM | Admit: 2017-05-01 | Discharge: 2017-05-01 | Disposition: A | Discharge status: Home / Self Care | Payer: Self-pay | Attending: Emergency Medicine | Admitting: Emergency Medicine

## 2017-05-01 ENCOUNTER — Other Ambulatory Visit: Payer: Self-pay

## 2017-05-01 DIAGNOSIS — Z760 Encounter for issue of repeat prescription: Secondary | ICD-10-CM

## 2017-05-01 DIAGNOSIS — J4520 Mild intermittent asthma, uncomplicated: Secondary | ICD-10-CM | POA: Insufficient documentation

## 2017-05-01 DIAGNOSIS — Z79899 Other long term (current) drug therapy: Secondary | ICD-10-CM | POA: Insufficient documentation

## 2017-05-01 DIAGNOSIS — F17210 Nicotine dependence, cigarettes, uncomplicated: Secondary | ICD-10-CM | POA: Insufficient documentation

## 2017-05-01 MED ORDER — ALBUTEROL SULFATE HFA 108 (90 BASE) MCG/ACT IN AERS
2.0000 | INHALATION_SPRAY | Freq: Once | RESPIRATORY_TRACT | Status: AC
  Administered 2017-05-01: 2 via RESPIRATORY_TRACT
  Filled 2017-05-01: qty 6.7

## 2017-05-01 MED ORDER — ALBUTEROL SULFATE HFA 108 (90 BASE) MCG/ACT IN AERS: 2.0000 | INHALATION_SPRAY | RESPIRATORY_TRACT | 0 refills | Status: DC | PRN

## 2017-05-01 NOTE — ED Triage Notes (Signed)
Patient states she is having asthma flare up and needing a refill of inhaler.

## 2017-05-01 NOTE — Discharge Instructions (Signed)
Continue your your inhaler, 2 puffs every 4 hours as needed.  Please follow-up with a family doctor.

## 2017-05-01 NOTE — ED Provider Notes (Signed)
[facility] EMERGENCY DEPARTMENT Provider Note   CSN: [id number] Arrival date & time: 05/01/17  1212     History   Chief Complaint Chief Complaint  Patient presents with  . Medication Refill    HPI Tracie Hodge is a 41 y.o. female.  HPI Tracie Hodge is a 41 y.o. female presents to emergency department complaining of asthma exacerbation.  Patient has history of asthma, COPD, current smoker, states she has been wheezing over the last several weeks.  She states this feels like her regular asthma flare.  She states she ran out of an inhaler and unable to get one filled until January when she gets her next paycheck.  She states she does not have an insurance and has to pay out-of-pocket.  She denies any fever or chills.  She denies any chest pain.  She states that this is very similar to her prior asthma issues  Past Medical History:  Diagnosis Date  . Anxiety   . Asthma   . Depression   . GERD (gastroesophageal reflux disease)   . Mental disorder   . Sleep apnea    sleep study done ? 3 years ago, does not use c-pap    Patient Active Problem List   Diagnosis Date Noted  . Cigarette nicotine dependence with nicotine-induced disorder 09/01/2016  . Chronic obstructive pulmonary disease (HCC) 09/01/2016  . Morbid obesity (HCC) 08/23/2016  . DDD (degenerative disc disease), lumbosacral 08/16/2016  . Cocaine use disorder, severe, dependence (HCC) 08/13/2016  . OSA (obstructive sleep apnea) 08/13/2016  . Tobacco use disorder 08/13/2016  . Asthma 08/13/2016  . Severe recurrent major depression without psychotic features (HCC) 08/12/2016  . PTSD (post-traumatic stress disorder) 08/06/2016  . GERD (gastroesophageal reflux disease) 08/06/2016    Past Surgical History:  Procedure Laterality Date  . ABDOMINAL HYSTERECTOMY  2010  . CESAREAN SECTION     x1  . CHOLECYSTECTOMY N/A 02/07/2013   Procedure: LAPAROSCOPIC CHOLECYSTECTOMY;  Surgeon: Axel FillerArmando Ramirez, MD;  Location: [facility]  OR;  Service: General;  Laterality: N/A;    OB History    No data available       Home Medications    Prior to Admission medications   Medication Sig Start Date End Date Taking? Authorizing Provider  albuterol (PROVENTIL HFA;VENTOLIN HFA) 108 (90 Base) MCG/ACT inhaler Inhale 1-2 puffs into the lungs every 6 (six) hours as needed for wheezing or shortness of breath. 03/28/17   Tommi RumpsSummers, Rhonda L, PA-C  diclofenac (VOLTAREN) 75 MG EC tablet Take 1 tablet (75 mg total) by mouth 2 (two) times daily as needed. 10/11/16   Jacquelin HawkingMcElroy, Shannon, PA-C  FLUoxetine (PROZAC) 20 MG capsule Take 1 capsule (20 mg total) by mouth daily. 08/18/16   Jimmy FootmanHernandez-Gonzalez, Andrea, MD  fluticasone (FLONASE) 50 MCG/ACT nasal spray Place 1 spray into both nostrils daily. 10/15/16 11/14/16  Long, Arlyss RepressJoshua G, MD  loratadine (CLARITIN) 10 MG tablet Take 1 tablet (10 mg total) by mouth daily. 10/15/16 11/14/16  Long, Arlyss RepressJoshua G, MD  meloxicam (MOBIC) 7.5 MG tablet Take 1 tablet (7.5 mg total) by mouth daily. 12/08/16   Caccavale, Sophia, PA-C  omeprazole (PRILOSEC) 20 MG capsule Take 20 mg by mouth daily.    [provider]  traZODone (DESYREL) 100 MG tablet Take 1 tablet (100 mg total) by mouth at bedtime. Patient taking differently: Take 200 mg by mouth at bedtime.  08/17/16   Jimmy FootmanHernandez-Gonzalez, Andrea, MD    Family History Family History  Problem Relation Age of Onset  .  COPD Father   . Depression Paternal Grandmother     Social History Social History   Tobacco Use  . Smoking status: Current Every Day Smoker    Packs/day: 0.75    Years: 30.00    Pack years: 22.50    Types: Cigarettes  . Smokeless tobacco: Never Used  Substance Use Topics  . Alcohol use: No    Comment: never a heavy drinker- last drank april 2018  . Drug use: Yes    Types: Cocaine, "Crack" cocaine, Marijuana    Comment: last use 08-11-16     Allergies   Patient has no known allergies.   Review of Systems Review of Systems    Constitutional: Negative for chills and fever.  Respiratory: Positive for shortness of breath and wheezing. Negative for cough and chest tightness.   Cardiovascular: Negative for chest pain, palpitations and leg swelling.  Gastrointestinal: Negative for abdominal pain, diarrhea, nausea and vomiting.  Musculoskeletal: Negative for arthralgias, myalgias, neck pain and neck stiffness.  Skin: Negative for rash.  Neurological: Negative for dizziness, weakness and headaches.  All other systems reviewed and are negative.    Physical Exam Updated Vital Signs BP 119/80 (BP Location: Right Arm)   Pulse 71   Temp 97.9 F (36.6 C) (Oral)   Resp 18   Ht 5\' 3"  (1.6 m)   Wt 113.4 kg (250 lb)   LMP  (LMP Unknown)   SpO2 97%   BMI 44.29 kg/m   Physical Exam  Constitutional: She appears well-developed and well-nourished. No distress.  HENT:  Head: Normocephalic.  Eyes: Conjunctivae are normal.  Neck: Neck supple.  Cardiovascular: Normal rate, regular rhythm and normal heart sounds.  Pulmonary/Chest: Effort normal. No respiratory distress. She has wheezes. She has no rales.  Expiratory wheezes bilaterally  Musculoskeletal: She exhibits no edema.  Neurological: She is alert.  Skin: Skin is warm and dry.  Psychiatric: She has a normal mood and affect. Her behavior is normal.  Nursing note and vitals reviewed.    ED Treatments / Results  Labs (all labs ordered are listed, but only abnormal results are displayed) Labs Reviewed - No data to display  EKG  EKG Interpretation None       Radiology No results found.  Procedures Procedures (including critical care time)  Medications Ordered in ED Medications  albuterol (PROVENTIL HFA;VENTOLIN HFA) 108 (90 Base) MCG/ACT inhaler 2 puff (not administered)     Initial Impression / Assessment and Plan / ED Course  I have reviewed the triage vital signs and the nursing notes.  Pertinent labs & imaging results that were available  during my care of the patient were reviewed by me and considered in my medical decision making (see chart for details).     Patient in emergency department with wheezing.  Similar to prior asthma problems.  Ran out of the inhaler and is here just for inhaler refill.  She denies any flulike symptoms.  No chest pain.  Vital signs are normal her with an inhaler and a prescription for refill once she gets more bonding.  We will have her follow-up with family doctor.  Return precautions discussed.  Vitals:   05/01/17 1224 05/01/17 1339  BP: 119/80 128/79  Pulse: 71 71  Resp: 18 18  Temp: 97.9 F (36.6 C) 98 F (36.7 C)  TempSrc: Oral Oral  SpO2: 97% 98%  Weight: 113.4 kg (250 lb)   Height: 5\' 3"  (1.6 m)      Final Clinical Impressions(s) /  ED Diagnoses   Final diagnoses:  Medication refill  Mild intermittent asthma without complication    ED Discharge Orders        Ordered    albuterol (PROVENTIL HFA;VENTOLIN HFA) 108 (90 Base) MCG/ACT inhaler  Every 4 hours PRN     05/01/17 1326       Jaynie Crumble, PA-C 05/01/17 1444    Donnetta Hutching, MD 05/03/17 1402

## 2017-05-17 ENCOUNTER — Other Ambulatory Visit: Payer: Self-pay

## 2017-05-17 ENCOUNTER — Emergency Department
Admission: EM | Admit: 2017-05-17 | Discharge: 2017-05-17 | Disposition: A | Discharge status: Home / Self Care | Payer: Self-pay | Attending: Emergency Medicine | Admitting: Emergency Medicine

## 2017-05-17 ENCOUNTER — Encounter: Payer: Self-pay | Admitting: *Deleted

## 2017-05-17 DIAGNOSIS — J45909 Unspecified asthma, uncomplicated: Secondary | ICD-10-CM | POA: Insufficient documentation

## 2017-05-17 DIAGNOSIS — F419 Anxiety disorder, unspecified: Secondary | ICD-10-CM | POA: Insufficient documentation

## 2017-05-17 DIAGNOSIS — R197 Diarrhea, unspecified: Secondary | ICD-10-CM | POA: Insufficient documentation

## 2017-05-17 DIAGNOSIS — R5381 Other malaise: Secondary | ICD-10-CM | POA: Insufficient documentation

## 2017-05-17 DIAGNOSIS — F17210 Nicotine dependence, cigarettes, uncomplicated: Secondary | ICD-10-CM | POA: Insufficient documentation

## 2017-05-17 DIAGNOSIS — R5383 Other fatigue: Secondary | ICD-10-CM

## 2017-05-17 LAB — COMPREHENSIVE METABOLIC PANEL
ALBUMIN: 4.1 g/dL (ref 3.5–5.0)
ALT: 16 U/L (ref 14–54)
ANION GAP: 10 (ref 5–15)
AST: 22 U/L (ref 15–41)
Alkaline Phosphatase: 113 U/L (ref 38–126)
BUN: 8 mg/dL (ref 6–20)
CHLORIDE: 104 mmol/L (ref 101–111)
CO2: 26 mmol/L (ref 22–32)
Calcium: 9 mg/dL (ref 8.9–10.3)
Creatinine, Ser: 0.57 mg/dL (ref 0.44–1.00)
GFR calc non Af Amer: >60 mL/min (ref >60)
Glucose, Bld: 84 mg/dL (ref 65–99)
POTASSIUM: 3.6 mmol/L (ref 3.5–5.1)
SODIUM: 140 mmol/L (ref 135–145)
Total Bilirubin: 0.5 mg/dL (ref 0.3–1.2)
Total Protein: 7.7 g/dL (ref 6.5–8.1)

## 2017-05-17 LAB — CBC
HCT: 39.8 % (ref 35.0–47.0)
Hemoglobin: 13.4 g/dL (ref 12.0–16.0)
MCH: 30.1 pg (ref 26.0–34.0)
MCHC: 33.7 g/dL (ref 32.0–36.0)
MCV: 89.3 fL (ref 80.0–100.0)
PLATELETS: 318 10*3/uL (ref 150–440)
RBC: 4.46 MIL/uL (ref 3.80–5.20)
RDW: 13.2 % (ref 11.5–14.5)
WBC: 12 10*3/uL — ABNORMAL HIGH (ref 3.6–11.0)

## 2017-05-17 LAB — MAGNESIUM: Magnesium: 1.9 mg/dL (ref 1.7–2.4)

## 2017-05-17 LAB — LIPASE, BLOOD: Lipase: 26 U/L (ref 11–51)

## 2017-05-17 MED ORDER — SODIUM CHLORIDE 0.9 % IV BOLUS (SEPSIS)
1000.0000 mL | INTRAVENOUS | Status: AC
  Administered 2017-05-17: 1000 mL via INTRAVENOUS

## 2017-05-17 NOTE — ED Triage Notes (Signed)
Pt to ED reporting abd pain, weakness, aching and diarrhea since yesterday.  Pt denies fevers at home and denies NV. Pt reports she has been eating and drinking normally but reports a decreased appetite.

## 2017-05-17 NOTE — ED Provider Notes (Signed)
[facility] Emergency Department Provider Note  ____________________________________________   First MD Initiated Contact with Patient 05/17/17 1443     (approximate)  I have reviewed the triage vital signs and the nursing notes.   HISTORY  Chief Complaint Abdominal Pain    HPI Tracie Hodge is a 42 y.o. female with history as described below who presents for evaluation primarily of diarrhea for the last 3 days, but also for decreased appetite and general malaise.  She had 5 or 6 episodes of loose stool yesterday and 3 episodes this morning.  She also had some loose stools the prior day.  She has had no nausea, vomiting, nor abdominal pain.  She has had some lightheadedness and generalized weakness but no near syncopal episodes.  She has some roommates who have been ill recently but she is not sure about their symptoms.  She denies difficulty breathing and chest pain.  She denies fever and chills.  She does not think the symptoms started shortly after having anything to eat such as a bad food exposure.  She denies vaginal bleeding and dysuria.  She describes the symptoms as moderate to severe and nothing makes them better or worse.  Past Medical History:  Diagnosis Date  . Anxiety   . Asthma   . Depression   . GERD (gastroesophageal reflux disease)   . Mental disorder   . Sleep apnea    sleep study done ? 3 years ago, does not use c-pap    Patient Active Problem List   Diagnosis Date Noted  . Cigarette nicotine dependence with nicotine-induced disorder 09/01/2016  . Chronic obstructive pulmonary disease (HCC) 09/01/2016  . Morbid obesity (HCC) 08/23/2016  . DDD (degenerative disc disease), lumbosacral 08/16/2016  . Cocaine use disorder, severe, dependence (HCC) 08/13/2016  . OSA (obstructive sleep apnea) 08/13/2016  . Tobacco use disorder 08/13/2016  . Asthma 08/13/2016  . Severe recurrent major depression without psychotic features (HCC) 08/12/2016   . PTSD (post-traumatic stress disorder) 08/06/2016  . GERD (gastroesophageal reflux disease) 08/06/2016    Past Surgical History:  Procedure Laterality Date  . ABDOMINAL HYSTERECTOMY  2010  . CESAREAN SECTION     x1  . CHOLECYSTECTOMY N/A 02/07/2013   Procedure: LAPAROSCOPIC CHOLECYSTECTOMY;  Surgeon: Axel Filler, MD;  Location: [facility] OR;  Service: General;  Laterality: N/A;    Prior to Admission medications   Medication Sig Start Date End Date Taking? Authorizing Provider  albuterol (PROVENTIL HFA;VENTOLIN HFA) 108 (90 Base) MCG/ACT inhaler Inhale 1-2 puffs into the lungs every 6 (six) hours as needed for wheezing or shortness of breath. 03/28/17   Tommi Rumps, PA-C  albuterol (PROVENTIL HFA;VENTOLIN HFA) 108 (90 Base) MCG/ACT inhaler Inhale 2 puffs into the lungs every 4 (four) hours as needed for wheezing or shortness of breath. 05/01/17   Kirichenko, Lemont Fillers, PA-C  diclofenac (VOLTAREN) 75 MG EC tablet Take 1 tablet (75 mg total) by mouth 2 (two) times daily as needed. 10/11/16   Jacquelin Hawking, PA-C  FLUoxetine (PROZAC) 20 MG capsule Take 1 capsule (20 mg total) by mouth daily. 08/18/16   Jimmy Footman, MD  fluticasone (FLONASE) 50 MCG/ACT nasal spray Place 1 spray into both nostrils daily. 10/15/16 11/14/16  Long, Arlyss Repress, MD  loratadine (CLARITIN) 10 MG tablet Take 1 tablet (10 mg total) by mouth daily. 10/15/16 11/14/16  Long, Arlyss Repress, MD  meloxicam (MOBIC) 7.5 MG tablet Take 1 tablet (7.5 mg total) by mouth daily. 12/08/16   Caccavale, Sophia,  PA-C  omeprazole (PRILOSEC) 20 MG capsule Take 20 mg by mouth daily.    [provider]  traZODone (DESYREL) 100 MG tablet Take 1 tablet (100 mg total) by mouth at bedtime. Patient taking differently: Take 200 mg by mouth at bedtime.  08/17/16   Jimmy Footman, MD    Allergies Patient has no known allergies.  Family History  Problem Relation Age of Onset  . COPD Father   . Depression Paternal  Grandmother     Social History Social History   Tobacco Use  . Smoking status: Current Every Day Smoker    Packs/day: 0.75    Years: 30.00    Pack years: 22.50    Types: Cigarettes  . Smokeless tobacco: Never Used  Substance Use Topics  . Alcohol use: No    Comment: never a heavy drinker- last drank april 2018  . Drug use: Yes    Types: Cocaine, "Crack" cocaine, Marijuana    Comment: last use 08-11-16    Review of Systems Constitutional: No fever/chills Eyes: No photophobia ENT: No sore throat. Cardiovascular: Denies chest pain. Respiratory: Denies shortness of breath.  No cough. Gastrointestinal: Loose stools / diarrhea x 3 days.  No abdominal pain.  No nausea, no vomiting.   Genitourinary: Negative for dysuria.  No vaginal bleeding. Musculoskeletal: Negative for neck pain.  Negative for back pain. Integumentary: Negative for rash. Neurological: Negative for headaches, focal weakness or numbness.   ____________________________________________   PHYSICAL EXAM:  VITAL SIGNS: ED Triage Vitals  Enc Vitals Group     BP 05/17/17 1311 91/63     Pulse Rate 05/17/17 1311 71     Resp 05/17/17 1311 13     Temp 05/17/17 1311 98.1 F (36.7 C)     Temp Source 05/17/17 1311 Oral     SpO2 05/17/17 1311 94 %     Weight 05/17/17 1312 113.4 kg (250 lb)     Height 05/17/17 1312 1.626 m (5\' 4" )     Head Circumference --      Peak Flow --      Pain Score 05/17/17 1314 6     Pain Loc --      Pain Edu? --      Excl. in GC? --     Constitutional: Alert and oriented. Well appearing and in no acute distress. Eyes: Conjunctivae are normal.  Head: Atraumatic. Nose: No congestion/rhinnorhea. Mouth/Throat: Mucous membranes are moist. Neck: No stridor.  No meningeal signs.   Cardiovascular: Normal rate, regular rhythm. Good peripheral circulation. Grossly normal heart sounds. Respiratory: Normal respiratory effort.  No retractions. Lungs CTAB. Gastrointestinal: Soft and nontender.  No distention.  Musculoskeletal: No lower extremity tenderness nor edema. No gross deformities of extremities. Neurologic:  Normal speech and language. No gross focal neurologic deficits are appreciated.  Skin:  Skin is warm, dry and intact. No rash noted. Psychiatric: Mood and affect are normal. Speech and behavior are normal.  ____________________________________________   LABS (all labs ordered are listed, but only abnormal results are displayed)  Labs Reviewed  CBC - Abnormal; Notable for the following components:      Result Value   WBC 12.0 (*)    All other components within normal limits  COMPREHENSIVE METABOLIC PANEL  LIPASE, BLOOD  MAGNESIUM  URINALYSIS, ROUTINE W REFLEX MICROSCOPIC  POC URINE PREG, ED   ____________________________________________  EKG  None - EKG not ordered by ED physician ____________________________________________  RADIOLOGY   No results found.  ____________________________________________   PROCEDURES  Critical Care performed: No   Procedure(s) performed:   Procedures   ____________________________________________   INITIAL IMPRESSION / ASSESSMENT AND PLAN / ED COURSE  As part of my medical decision making, I reviewed the following data within the electronic MEDICAL RECORD NUMBER Nursing notes reviewed and incorporated, Labs reviewed  and Notes from prior ED visits    Differential diagnosis includes, but is not limited to, viral diarrhea, bacterial infection, bad food exposure, diverticulitis, obstruction, other intra-abdominal pathology.  Fortunately the patient is very well-appearing and has stable vital signs although her blood pressure is a little bit on the low side.  She is in no acute distress and does not appear clinically dehydrated.  She has a very mild leukocytosis with a white count of 12 but the rest of her blood work had to be redrawn and is still pending.  I think most likely she is slightly volume depleted in the  setting of decreased p.o. intake and multiple loose stools over the last several days.  She has absolutely no tenderness to palpation of her abdomen which is reassuring indicates that she does not need imaging at this time.  I will provide a liter of fluids and we will await the rest of her lab results and reassess.  She is comfortable with that plan and is in no acute distress.  Clinical Course as of May 17 1614  Tue May 17, 2017  1613 The patient states that she feels better and wants to go home.  She has only got about 200 mL of.  All of her lab work is reassuring with no evidence of acute electrolyte abnormalities.  I told her she can stay for the fluids or go if she prefers and she does prefer to go home and rest.  I gave my usual and customary return precautions.   [CF]    Clinical Course User Index [CF] Loleta RoseForbach, Jamira Barfuss, MD    ____________________________________________  FINAL CLINICAL IMPRESSION(S) / ED DIAGNOSES  Final diagnoses:  Diarrhea, unspecified type  Malaise and fatigue     MEDICATIONS GIVEN DURING THIS VISIT:  Medications  sodium chloride 0.9 % bolus 1,000 mL (1,000 mLs Intravenous New Bag/Given 05/17/17 1540)     ED Discharge Orders    None       Note:  This document was prepared using Dragon voice recognition software and may include unintentional dictation errors.    Loleta RoseForbach, Escarlet Saathoff, MD 05/17/17 [phone]

## 2017-05-17 NOTE — ED Notes (Addendum)
Pt reports loss of appetite and diarrhea since Sunday PM. Felt bad enough to leave work early yesterday. Pt reports 5-6 episodes of diarrhea in the last 24 hours; 3x since this morning. Able to eat small bites of food despite loss of appetite. Denies nausea, vomiting and abdominal pain. Reports lightheadedness and weakness since yesterday. States she has two older roommates who were sick in the last two weeks; unsure of exact symptoms.

## 2017-05-17 NOTE — Discharge Instructions (Signed)

## 2017-05-17 NOTE — ED Notes (Signed)
Topaz not recognized. Topaz removed and computer restarted again

## 2017-06-22 ENCOUNTER — Encounter (HOSPITAL_COMMUNITY): Payer: Self-pay

## 2017-06-22 ENCOUNTER — Other Ambulatory Visit: Payer: Self-pay

## 2017-06-22 ENCOUNTER — Emergency Department (HOSPITAL_COMMUNITY)
Admission: EM | Admit: 2017-06-22 | Discharge: 2017-06-22 | Disposition: A | Discharge status: Home / Self Care | Payer: Self-pay | Attending: Emergency Medicine | Admitting: Emergency Medicine

## 2017-06-22 DIAGNOSIS — F17210 Nicotine dependence, cigarettes, uncomplicated: Secondary | ICD-10-CM | POA: Insufficient documentation

## 2017-06-22 DIAGNOSIS — R062 Wheezing: Secondary | ICD-10-CM

## 2017-06-22 DIAGNOSIS — R6889 Other general symptoms and signs: Secondary | ICD-10-CM

## 2017-06-22 DIAGNOSIS — Z79899 Other long term (current) drug therapy: Secondary | ICD-10-CM | POA: Insufficient documentation

## 2017-06-22 DIAGNOSIS — J45909 Unspecified asthma, uncomplicated: Secondary | ICD-10-CM | POA: Insufficient documentation

## 2017-06-22 DIAGNOSIS — J111 Influenza due to unidentified influenza virus with other respiratory manifestations: Secondary | ICD-10-CM | POA: Insufficient documentation

## 2017-06-22 MED ORDER — PREDNISONE 20 MG PO TABS: 40.0000 mg | ORAL_TABLET | Freq: Every day | ORAL | 0 refills | Status: DC

## 2017-06-22 MED ORDER — ALBUTEROL SULFATE (2.5 MG/3ML) 0.083% IN NEBU
2.5000 mg | INHALATION_SOLUTION | Freq: Once | RESPIRATORY_TRACT | Status: AC
  Administered 2017-06-22: 2.5 mg via RESPIRATORY_TRACT
  Filled 2017-06-22: qty 3

## 2017-06-22 MED ORDER — IPRATROPIUM-ALBUTEROL 0.5-2.5 (3) MG/3ML IN SOLN
3.0000 mL | Freq: Once | RESPIRATORY_TRACT | Status: AC
  Administered 2017-06-22: 3 mL via RESPIRATORY_TRACT
  Filled 2017-06-22: qty 3

## 2017-06-22 MED ORDER — BENZONATATE 200 MG PO CAPS: 200.0000 mg | ORAL_CAPSULE | Freq: Three times a day (TID) | ORAL | 0 refills | Status: DC

## 2017-06-22 MED ORDER — ALBUTEROL SULFATE HFA 108 (90 BASE) MCG/ACT IN AERS
2.0000 | INHALATION_SPRAY | Freq: Once | RESPIRATORY_TRACT | Status: AC
  Administered 2017-06-22: 2 via RESPIRATORY_TRACT
  Filled 2017-06-22: qty 6.7

## 2017-06-22 MED ORDER — PREDNISONE 20 MG PO TABS
40.0000 mg | ORAL_TABLET | Freq: Once | ORAL | Status: AC
  Administered 2017-06-22: 40 mg via ORAL
  Filled 2017-06-22: qty 2

## 2017-06-22 NOTE — ED Provider Notes (Signed)
[facility] EMERGENCY DEPARTMENT Provider Note   CSN: [id number] Arrival date & time: 06/22/17  1742     History   Chief Complaint Chief Complaint  Patient presents with  . Weakness    HPI Tracie Hodge is a 42 y.o. female.  HPI  LYNELL GREENHOUSE is a 42 y.o. female who presents to the Emergency Department complaining of generalized body aches and fatigue.  She also  notes having nasal congestion cough and wheezing for 1 week.  She has been using her albuterol inhaler with some relief, but has recently ran out.  Cough has been nonproductive and associated with chest tightness.  Cough is worse with exertion.  She  has been taking over-the-counter cold and cough medications with no significant relief.  She denies known fever, chest pain, shortness of breath, hemoptysis and decreased appetite or activity.    Past Medical History:  Diagnosis Date  . Anxiety   . Asthma   . Depression   . GERD (gastroesophageal reflux disease)   . Mental disorder   . Sleep apnea    sleep study done ? 3 years ago, does not use c-pap    Patient Active Problem List   Diagnosis Date Noted  . Cigarette nicotine dependence with nicotine-induced disorder 09/01/2016  . Chronic obstructive pulmonary disease (HCC) 09/01/2016  . Morbid obesity (HCC) 08/23/2016  . DDD (degenerative disc disease), lumbosacral 08/16/2016  . Cocaine use disorder, severe, dependence (HCC) 08/13/2016  . OSA (obstructive sleep apnea) 08/13/2016  . Tobacco use disorder 08/13/2016  . Asthma 08/13/2016  . Severe recurrent major depression without psychotic features (HCC) 08/12/2016  . PTSD (post-traumatic stress disorder) 08/06/2016  . GERD (gastroesophageal reflux disease) 08/06/2016    Past Surgical History:  Procedure Laterality Date  . ABDOMINAL HYSTERECTOMY  2010  . CESAREAN SECTION     x1  . CHOLECYSTECTOMY N/A 02/07/2013   Procedure: LAPAROSCOPIC CHOLECYSTECTOMY;  Surgeon: Axel Filler, MD;  Location: [facility] OR;   Service: General;  Laterality: N/A;    OB History    No data available       Home Medications    Prior to Admission medications   Medication Sig Start Date End Date Taking? Authorizing Provider  albuterol (PROVENTIL HFA;VENTOLIN HFA) 108 (90 Base) MCG/ACT inhaler Inhale 1-2 puffs into the lungs every 6 (six) hours as needed for wheezing or shortness of breath. 03/28/17   Tommi Rumps, PA-C  albuterol (PROVENTIL HFA;VENTOLIN HFA) 108 (90 Base) MCG/ACT inhaler Inhale 2 puffs into the lungs every 4 (four) hours as needed for wheezing or shortness of breath. 05/01/17   Kirichenko, Lemont Fillers, PA-C  diclofenac (VOLTAREN) 75 MG EC tablet Take 1 tablet (75 mg total) by mouth 2 (two) times daily as needed. 10/11/16   Jacquelin Hawking, PA-C  FLUoxetine (PROZAC) 20 MG capsule Take 1 capsule (20 mg total) by mouth daily. 08/18/16   Jimmy Footman, MD  fluticasone (FLONASE) 50 MCG/ACT nasal spray Place 1 spray into both nostrils daily. 10/15/16 11/14/16  Long, Arlyss Repress, MD  loratadine (CLARITIN) 10 MG tablet Take 1 tablet (10 mg total) by mouth daily. 10/15/16 11/14/16  Long, Arlyss Repress, MD  meloxicam (MOBIC) 7.5 MG tablet Take 1 tablet (7.5 mg total) by mouth daily. 12/08/16   Caccavale, Sophia, PA-C  omeprazole (PRILOSEC) 20 MG capsule Take 20 mg by mouth daily.    [provider]  traZODone (DESYREL) 100 MG tablet Take 1 tablet (100 mg total) by mouth at bedtime. Patient taking differently: Take  200 mg by mouth at bedtime.  08/17/16   Jimmy FootmanHernandez-Gonzalez, Andrea, MD    Family History Family History  Problem Relation Age of Onset  . COPD Father   . Depression Paternal Grandmother     Social History Social History   Tobacco Use  . Smoking status: Current Every Day Smoker    Packs/day: 0.75    Years: 30.00    Pack years: 22.50    Types: Cigarettes  . Smokeless tobacco: Never Used  Substance Use Topics  . Alcohol use: No    Comment: never a heavy drinker- last drank april  2018  . Drug use: Yes    Types: Cocaine, "Crack" cocaine, Marijuana    Comment: last use 08-11-16     Allergies   Patient has no known allergies.   Review of Systems Review of Systems  Constitutional: Positive for chills and fatigue. Negative for appetite change and fever.  HENT: Positive for congestion. Negative for sore throat and trouble swallowing.   Respiratory: Positive for cough and wheezing. Negative for chest tightness and shortness of breath.   Cardiovascular: Negative for chest pain.  Gastrointestinal: Negative for abdominal pain, nausea and vomiting.  Genitourinary: Negative for dysuria.  Musculoskeletal: Positive for myalgias. Negative for arthralgias.  Skin: Negative for rash.  Neurological: Negative for dizziness, weakness (Generalized weakness) and numbness.  Hematological: Negative for adenopathy.  All other systems reviewed and are negative.    Physical Exam Updated Vital Signs BP (!) 145/85 (BP Location: Right Arm)   Pulse 67   Temp 98.4 F (36.9 C) (Oral)   Resp 18   Ht 5\' 3"  (1.6 m)   Wt 113.4 kg (250 lb)   LMP  (LMP Unknown)   SpO2 98%   BMI 44.29 kg/m   Physical Exam  Constitutional: She is oriented to person, place, and time. She appears well-developed and well-nourished. No distress.  HENT:  Head: Normocephalic and atraumatic.  Right Ear: Tympanic membrane and ear canal normal.  Left Ear: Tympanic membrane and ear canal normal.  Mouth/Throat: Uvula is midline, oropharynx is clear and moist and mucous membranes are normal. No oropharyngeal exudate.  Eyes: EOM are normal. Pupils are equal, round, and reactive to light.  Neck: Normal range of motion, full passive range of motion without pain and phonation normal. Neck supple.  Cardiovascular: Normal rate, regular rhythm and intact distal pulses.  No murmur heard. Pulmonary/Chest: Effort normal. No stridor. No respiratory distress. She has wheezes. She has no rales. She exhibits no tenderness.    Slightly diminished lung sounds bilaterally with expiratory wheezes.  Patient is able to speak in full sentences without distress  Musculoskeletal: She exhibits no edema.  Lymphadenopathy:    She has no cervical adenopathy.  Neurological: She is alert and oriented to person, place, and time. She exhibits normal muscle tone. Coordination normal.  Skin: Skin is warm and dry.  Psychiatric: She has a normal mood and affect.  Nursing note and vitals reviewed.    ED Treatments / Results  Labs (all labs ordered are listed, but only abnormal results are displayed) Labs Reviewed - No data to display  EKG  EKG Interpretation None       Radiology No results found.  Procedures Procedures (including critical care time)  Medications Ordered in ED Medications  predniSONE (DELTASONE) tablet 40 mg (not administered)  ipratropium-albuterol (DUONEB) 0.5-2.5 (3) MG/3ML nebulizer solution 3 mL (3 mLs Nebulization Given 06/22/17 1846)  albuterol (PROVENTIL) (2.5 MG/3ML) 0.083% nebulizer solution 2.5 mg (  2.5 mg Nebulization Given 06/22/17 1846)  albuterol (PROVENTIL HFA;VENTOLIN HFA) 108 (90 Base) MCG/ACT inhaler 2 puff (2 puffs Inhalation Given 06/22/17 1855)     Initial Impression / Assessment and Plan / ED Course  I have reviewed the triage vital signs and the nursing notes.  Pertinent labs & imaging results that were available during my care of the patient were reviewed by me and considered in my medical decision making (see chart for details).     On recheck, lung sounds improved.  Pt feeling better.  Appears safe for d/c home.  Patient requesting albuterol inhaler, one dispensed for home use.  Prescription written for prednisone vitals reviewed.  Likely URI with exacerbation of asthma, doubtful PE patient agrees to treatment plan and return precautions discussed  Final Clinical Impressions(s) / ED Diagnoses   Final diagnoses:  Flu-like symptoms  Wheezing    ED Discharge Orders     None       Rosey Bath 06/23/17 1236    Mesner, Barbara Cower, MD 06/24/17 1503

## 2017-06-22 NOTE — ED Triage Notes (Signed)
Pt has no energy, which started yesterday. Pt has had a lot of congestion and coughing. Pt also states her head is stopped up. Pt is also out of her inhaler. Pt is having chills as well. Pt has been taking OTC cold and flu meds with no relief.

## 2017-06-22 NOTE — Discharge Instructions (Signed)
Drink plenty of fluids.  Alternate tylenol and ibuprofen every 4-6 hrs for fever and or body aches.  Call the clinic listed to establish primary care.

## 2017-07-19 ENCOUNTER — Encounter (HOSPITAL_COMMUNITY): Payer: Self-pay | Admitting: Emergency Medicine

## 2017-07-19 ENCOUNTER — Emergency Department (HOSPITAL_COMMUNITY)
Admission: EM | Admit: 2017-07-19 | Discharge: 2017-07-19 | Disposition: A | Discharge status: Home / Self Care | Payer: Self-pay | Attending: Emergency Medicine | Admitting: Emergency Medicine

## 2017-07-19 ENCOUNTER — Other Ambulatory Visit: Payer: Self-pay

## 2017-07-19 DIAGNOSIS — Z79899 Other long term (current) drug therapy: Secondary | ICD-10-CM | POA: Insufficient documentation

## 2017-07-19 DIAGNOSIS — J069 Acute upper respiratory infection, unspecified: Secondary | ICD-10-CM | POA: Insufficient documentation

## 2017-07-19 DIAGNOSIS — B9789 Other viral agents as the cause of diseases classified elsewhere: Secondary | ICD-10-CM | POA: Insufficient documentation

## 2017-07-19 DIAGNOSIS — J45909 Unspecified asthma, uncomplicated: Secondary | ICD-10-CM | POA: Insufficient documentation

## 2017-07-19 DIAGNOSIS — F17210 Nicotine dependence, cigarettes, uncomplicated: Secondary | ICD-10-CM | POA: Insufficient documentation

## 2017-07-19 DIAGNOSIS — J449 Chronic obstructive pulmonary disease, unspecified: Secondary | ICD-10-CM | POA: Insufficient documentation

## 2017-07-19 DIAGNOSIS — J40 Bronchitis, not specified as acute or chronic: Secondary | ICD-10-CM | POA: Insufficient documentation

## 2017-07-19 MED ORDER — PREDNISONE 20 MG PO TABS: 40.0000 mg | ORAL_TABLET | Freq: Every day | ORAL | 0 refills | Status: DC

## 2017-07-19 MED ORDER — ALBUTEROL SULFATE HFA 108 (90 BASE) MCG/ACT IN AERS
1.0000 | INHALATION_SPRAY | RESPIRATORY_TRACT | Status: DC | PRN
  Administered 2017-07-19: 2 via RESPIRATORY_TRACT

## 2017-07-19 MED ORDER — ALBUTEROL SULFATE HFA 108 (90 BASE) MCG/ACT IN AERS
INHALATION_SPRAY | RESPIRATORY_TRACT | Status: AC
  Filled 2017-07-19: qty 6.7

## 2017-07-19 NOTE — ED Provider Notes (Signed)
[facility] EMERGENCY DEPARTMENT Provider Note   CSN: [id number] Arrival date & time: 07/19/17  2130     History   Chief Complaint Chief Complaint  Patient presents with  . Cough    HPI Tracie Hodge is a 42 y.o. female who presents with cough and congestion.  Past medical history significant for asthma, COPD, tobacco use, obesity, substance abuse.  Patient states that she was seen in the ED approximately a month ago for similar symptoms.  She did get better after an inhaler and steroids.  Over the past couple of days she has had gradually worsening nasal congestion, rhinorrhea, chest congestion.  She is also had wheezing but does not feel significantly short of breath.  She possibly had a fever on Saturday but is unsure.  She is states she has been working long hours and feels worn out.  She denies ear pain, sore throat, chest pain, abdominal pain, nausea, vomiting, diarrhea.  She is requesting an inhaler and a work note.  HPI  Past Medical History:  Diagnosis Date  . Anxiety   . Asthma   . Depression   . GERD (gastroesophageal reflux disease)   . Mental disorder   . Sleep apnea    sleep study done ? 3 years ago, does not use c-pap    Patient Active Problem List   Diagnosis Date Noted  . Cigarette nicotine dependence with nicotine-induced disorder 09/01/2016  . Chronic obstructive pulmonary disease (HCC) 09/01/2016  . Morbid obesity (HCC) 08/23/2016  . DDD (degenerative disc disease), lumbosacral 08/16/2016  . Cocaine use disorder, severe, dependence (HCC) 08/13/2016  . OSA (obstructive sleep apnea) 08/13/2016  . Tobacco use disorder 08/13/2016  . Asthma 08/13/2016  . Severe recurrent major depression without psychotic features (HCC) 08/12/2016  . PTSD (post-traumatic stress disorder) 08/06/2016  . GERD (gastroesophageal reflux disease) 08/06/2016    Past Surgical History:  Procedure Laterality Date  . ABDOMINAL HYSTERECTOMY  2010  . CESAREAN SECTION     x1  .  CHOLECYSTECTOMY N/A 02/07/2013   Procedure: LAPAROSCOPIC CHOLECYSTECTOMY;  Surgeon: Axel Filler, MD;  Location: [facility] OR;  Service: General;  Laterality: N/A;    OB History    No data available       Home Medications    Prior to Admission medications   Medication Sig Start Date End Date Taking? Authorizing Provider  albuterol (PROVENTIL HFA;VENTOLIN HFA) 108 (90 Base) MCG/ACT inhaler Inhale 1-2 puffs into the lungs every 6 (six) hours as needed for wheezing or shortness of breath. 03/28/17   Tommi Rumps, PA-C  albuterol (PROVENTIL HFA;VENTOLIN HFA) 108 (90 Base) MCG/ACT inhaler Inhale 2 puffs into the lungs every 4 (four) hours as needed for wheezing or shortness of breath. 05/01/17   Kirichenko, Tatyana, PA-C  benzonatate (TESSALON) 200 MG capsule Take 1 capsule (200 mg total) by mouth every 8 (eight) hours. 06/22/17   Triplett, Tammy, PA-C  diclofenac (VOLTAREN) 75 MG EC tablet Take 1 tablet (75 mg total) by mouth 2 (two) times daily as needed. 10/11/16   Jacquelin Hawking, PA-C  FLUoxetine (PROZAC) 20 MG capsule Take 1 capsule (20 mg total) by mouth daily. 08/18/16   Jimmy Footman, MD  fluticasone (FLONASE) 50 MCG/ACT nasal spray Place 1 spray into both nostrils daily. 10/15/16 11/14/16  Long, Arlyss Repress, MD  loratadine (CLARITIN) 10 MG tablet Take 1 tablet (10 mg total) by mouth daily. 10/15/16 11/14/16  Long, Arlyss Repress, MD  meloxicam (MOBIC) 7.5 MG tablet Take 1 tablet (7.5  mg total) by mouth daily. 12/08/16   Caccavale, Sophia, PA-C  omeprazole (PRILOSEC) 20 MG capsule Take 20 mg by mouth daily.    [provider]  predniSONE (DELTASONE) 20 MG tablet Take 2 tablets (40 mg total) by mouth daily. 06/22/17   Triplett, Tammy, PA-C  traZODone (DESYREL) 100 MG tablet Take 1 tablet (100 mg total) by mouth at bedtime. Patient taking differently: Take 200 mg by mouth at bedtime.  08/17/16   Jimmy Footman, MD    Family History Family History  Problem Relation Age  of Onset  . COPD Father   . Depression Paternal Grandmother     Social History Social History   Tobacco Use  . Smoking status: Current Every Day Smoker    Packs/day: 0.75    Years: 30.00    Pack years: 22.50    Types: Cigarettes  . Smokeless tobacco: Never Used  Substance Use Topics  . Alcohol use: No    Comment: never a heavy drinker- last drank april 2018  . Drug use: Yes    Types: Cocaine, "Crack" cocaine, Marijuana    Comment: last use 08-11-16     Allergies   Patient has no known allergies.   Review of Systems Review of Systems  Constitutional: Positive for chills and fatigue.  HENT: Positive for congestion and rhinorrhea. Negative for ear pain and sore throat.   Respiratory: Positive for cough and wheezing. Negative for shortness of breath.   Cardiovascular: Negative for chest pain.  Gastrointestinal: Negative for abdominal pain, diarrhea, nausea and vomiting.     Physical Exam Updated Vital Signs BP 128/65   Pulse 68   Temp (!) 97.5 F (36.4 C) (Oral)   Resp 20   Ht 5\' 3"  (1.6 m)   Wt 113.4 kg (250 lb)   LMP  (LMP Unknown)   SpO2 95%   BMI 44.29 kg/m   Physical Exam  Constitutional: She is oriented to person, place, and time. She appears well-developed and well-nourished. No distress.  Obese, calm, cooperative  HENT:  Head: Normocephalic and atraumatic.  Right Ear: Hearing, tympanic membrane, external ear and ear canal normal.  Left Ear: Hearing, tympanic membrane, external ear and ear canal normal.  Nose: Mucosal edema present.  Mouth/Throat: Uvula is midline, oropharynx is clear and moist and mucous membranes are normal.  Eyes: Conjunctivae are normal. Pupils are equal, round, and reactive to light. Right eye exhibits no discharge. Left eye exhibits no discharge. No scleral icterus.  Neck: Normal range of motion.  Cardiovascular: Normal rate and regular rhythm.  Pulmonary/Chest: Effort normal. No respiratory distress. She has wheezes (Mild end  expiratory wheezes in the right upper lung).  Abdominal: She exhibits no distension.  Neurological: She is alert and oriented to person, place, and time.  Skin: Skin is warm and dry.  Psychiatric: She has a normal mood and affect. Her behavior is normal.  Nursing note and vitals reviewed.    ED Treatments / Results  Labs (all labs ordered are listed, but only abnormal results are displayed) Labs Reviewed - No data to display  EKG  EKG Interpretation None       Radiology No results found.  Procedures Procedures (including critical care time)  Medications Ordered in ED Medications - No data to display   Initial Impression / Assessment and Plan / ED Course  I have reviewed the triage vital signs and the nursing notes.  Pertinent labs & imaging results that were available during my care of the  patient were reviewed by me and considered in my medical decision making (see chart for details).  42 year old female presents with cough and congestion for 1 week.  She has multiple ED visits for the same.  She frequently requests refill of her albuterol inhaler and work notes.  Vital signs are normal.  Her exam is remarkable for very mild expiratory wheezes.  She was offered a breathing treatment and declined.  She is advised to use Flonase for nasal congestion and will refill albuterol inhaler and steroid burst. Work note was given.  Final Clinical Impressions(s) / ED Diagnoses   Final diagnoses:  Bronchitis  Viral URI with cough    ED Discharge Orders    None       Bethel BornGekas, Genora Arp Marie, PA-C 07/19/17 1013    Vanetta MuldersZackowski, Scott, MD 07/20/17 [phone]

## 2017-07-19 NOTE — ED Triage Notes (Signed)
Pt c/o of cough congestion, chills x 1 week. No fever.

## 2017-08-17 ENCOUNTER — Emergency Department (HOSPITAL_COMMUNITY)
Admission: EM | Admit: 2017-08-17 | Discharge: 2017-08-17 | Disposition: A | Discharge status: Home / Self Care | Payer: Self-pay | Attending: Emergency Medicine | Admitting: Emergency Medicine

## 2017-08-17 ENCOUNTER — Other Ambulatory Visit: Payer: Self-pay

## 2017-08-17 ENCOUNTER — Encounter (HOSPITAL_COMMUNITY): Payer: Self-pay | Admitting: *Deleted

## 2017-08-17 DIAGNOSIS — J45901 Unspecified asthma with (acute) exacerbation: Secondary | ICD-10-CM | POA: Insufficient documentation

## 2017-08-17 DIAGNOSIS — Z760 Encounter for issue of repeat prescription: Secondary | ICD-10-CM | POA: Insufficient documentation

## 2017-08-17 DIAGNOSIS — F17210 Nicotine dependence, cigarettes, uncomplicated: Secondary | ICD-10-CM | POA: Insufficient documentation

## 2017-08-17 MED ORDER — ALBUTEROL SULFATE HFA 108 (90 BASE) MCG/ACT IN AERS
2.0000 | INHALATION_SPRAY | Freq: Once | RESPIRATORY_TRACT | Status: AC
  Administered 2017-08-17: 2 via RESPIRATORY_TRACT
  Filled 2017-08-17: qty 6.7

## 2017-08-17 MED ORDER — PREDNISONE 50 MG PO TABS
60.0000 mg | ORAL_TABLET | Freq: Once | ORAL | Status: AC
  Administered 2017-08-17: 60 mg via ORAL
  Filled 2017-08-17: qty 1

## 2017-08-17 MED ORDER — PREDNISONE 50 MG PO TABS: 50.0000 mg | ORAL_TABLET | Freq: Every day | ORAL | 0 refills | Status: AC

## 2017-08-17 NOTE — ED Notes (Signed)
Pt wants an inhaler and a work note

## 2017-08-17 NOTE — ED Provider Notes (Signed)
[facility] EMERGENCY DEPARTMENT Provider Note   CSN: [id number] Arrival date & time: 08/17/17  1817     History   Chief Complaint Chief Complaint  Patient presents with  . Shortness of Breath    HPI Tracie Hodge is a 42 y.o. female with a history of asthma and who is a 0.75 pack/day smoker presenting for medication refill.  She is currently out of her albuterol MDI which she states she uses "most days"at least 1 or 2 puffs.  She does not have a current PCP but is [facility] [place].  She has never been on any daily maintenance medicines for her asthma symptoms.  She is starting a new job tomorrow in which she will be working in a [employer], sport and exercise which she describes as dusty and there is no air conditioning in the facility and she is concerned that her asthma will become worse in this environment.  She does report daily wheezing, she feels her breathing currently is at her baseline.  She denies fevers or chills, cough, chest pain.  The history is provided by the patient.    Past Medical History:  Diagnosis Date  . Anxiety   . Asthma   . Depression   . GERD (gastroesophageal reflux disease)   . Mental disorder   . Sleep apnea    sleep study done ? 3 years ago, does not use c-pap    Patient Active Problem List   Diagnosis Date Noted  . Cigarette nicotine dependence with nicotine-induced disorder 09/01/2016  . Chronic obstructive pulmonary disease (HCC) 09/01/2016  . Morbid obesity (HCC) 08/23/2016  . DDD (degenerative disc disease), lumbosacral 08/16/2016  . Cocaine use disorder, severe, dependence (HCC) 08/13/2016  . OSA (obstructive sleep apnea) 08/13/2016  . Tobacco use disorder 08/13/2016  . Asthma 08/13/2016  . Severe recurrent major depression without psychotic features (HCC) 08/12/2016  . PTSD (post-traumatic stress disorder) 08/06/2016  . GERD (gastroesophageal reflux disease) 08/06/2016    Past Surgical  History:  Procedure Laterality Date  . ABDOMINAL HYSTERECTOMY  2010  . CESAREAN SECTION     x1  . CHOLECYSTECTOMY N/A 02/07/2013   Procedure: LAPAROSCOPIC CHOLECYSTECTOMY;  Surgeon: Axel Filler, MD;  Location: [facility] OR;  Service: General;  Laterality: N/A;     OB History   None      Home Medications    Prior to Admission medications   Medication Sig Start Date End Date Taking? Authorizing Provider  benzonatate (TESSALON) 200 MG capsule Take 1 capsule (200 mg total) by mouth every 8 (eight) hours. 06/22/17   Triplett, Tammy, PA-C  diclofenac (VOLTAREN) 75 MG EC tablet Take 1 tablet (75 mg total) by mouth 2 (two) times daily as needed. 10/11/16   Jacquelin Hawking, PA-C  FLUoxetine (PROZAC) 20 MG capsule Take 1 capsule (20 mg total) by mouth daily. 08/18/16   Jimmy Footman, MD  fluticasone (FLONASE) 50 MCG/ACT nasal spray Place 1 spray into both nostrils daily. 10/15/16 11/14/16  Long, Arlyss Repress, MD  loratadine (CLARITIN) 10 MG tablet Take 1 tablet (10 mg total) by mouth daily. 10/15/16 11/14/16  Long, Arlyss Repress, MD  meloxicam (MOBIC) 7.5 MG tablet Take 1 tablet (7.5 mg total) by mouth daily. 12/08/16   Caccavale, Sophia, PA-C  omeprazole (PRILOSEC) 20 MG capsule Take 20 mg by mouth daily.    [provider]  predniSONE (DELTASONE) 50 MG tablet Take 1 tablet (50 mg total) by mouth daily for 4  days. 08/17/17 08/21/17  Burgess AmorIdol, Correne Lalani, PA-C  traZODone (DESYREL) 100 MG tablet Take 1 tablet (100 mg total) by mouth at bedtime. Patient taking differently: Take 200 mg by mouth at bedtime.  08/17/16   Jimmy FootmanHernandez-Gonzalez, Andrea, MD    Family History Family History  Problem Relation Age of Onset  . COPD Father   . Depression Paternal Grandmother     Social History Social History   Tobacco Use  . Smoking status: Current Every Day Smoker    Packs/day: 0.75    Years: 30.00    Pack years: 22.50    Types: Cigarettes  . Smokeless tobacco: Never Used  Substance Use Topics  . Alcohol  use: No    Comment: never a heavy drinker- last drank april 2018  . Drug use: Yes    Types: Cocaine, "Crack" cocaine, Marijuana    Comment: last use 08-11-16     Allergies   Patient has no known allergies.   Review of Systems Review of Systems  Constitutional: Negative for fever.  HENT: Negative for congestion and sore throat.   Eyes: Negative.   Respiratory: Positive for shortness of breath and wheezing. Negative for chest tightness.   Cardiovascular: Negative for chest pain.  Gastrointestinal: Negative for abdominal pain and nausea.  Genitourinary: Negative.   Musculoskeletal: Negative for arthralgias, joint swelling and neck pain.  Skin: Negative.  Negative for rash and wound.  Neurological: Negative for dizziness, weakness, light-headedness, numbness and headaches.  Psychiatric/Behavioral: Negative.      Physical Exam Updated Vital Signs BP 136/72 (BP Location: Left Arm)   Pulse 66   Temp 98.1 F (36.7 C) (Oral)   Resp 19   Ht 5\' 3"  (1.6 m)   Wt 117.9 kg (260 lb)   LMP  (LMP Unknown)   SpO2 100%   BMI 46.06 kg/m   Physical Exam  Constitutional: She appears well-developed and well-nourished.  HENT:  Head: Normocephalic and atraumatic.  Eyes: Conjunctivae are normal.  Neck: Normal range of motion.  Cardiovascular: Normal rate, regular rhythm, normal heart sounds and intact distal pulses.  Pulmonary/Chest: Effort normal. She has no decreased breath sounds. She has wheezes in the left upper field. She has no rhonchi. She has no rales.  Musculoskeletal: Normal range of motion.  Neurological: She is alert.  Skin: Skin is warm and dry.  Psychiatric: She has a normal mood and affect.  Nursing note and vitals reviewed.    ED Treatments / Results  Labs (all labs ordered are listed, but only abnormal results are displayed) Labs Reviewed - No data to display  EKG None  Radiology No results found.  Procedures Procedures (including critical care  time)  Medications Ordered in ED Medications  albuterol (PROVENTIL HFA;VENTOLIN HFA) 108 (90 Base) MCG/ACT inhaler 2 puff (2 puffs Inhalation Given 08/17/17 2052)  predniSONE (DELTASONE) tablet 60 mg (60 mg Oral Given 08/17/17 2134)     Initial Impression / Assessment and Plan / ED Course  I have reviewed the triage vital signs and the nursing notes.  Pertinent labs & imaging results that were available during my care of the patient were reviewed by me and considered in my medical decision making (see chart for details).     Patient with reported daily asthma symptoms.  Her exam today is fairly unremarkable, sparse wheeze in her left upper field.  She was given an albuterol MDI here with a spacer.  Since she is reporting daily symptoms will put her on a short course of  prednisone.  Discussed her need for probably being on a daily maintenance medication for her asthma.  She plans to establish care with a clinic in [place], hopefully within the next week.  Return precautions discussed.  She is also given referral to the [facility] clinic in [place] as an alternative.  Final Clinical Impressions(s) / ED Diagnoses   Final diagnoses:  Moderate asthma with acute exacerbation, unspecified whether persistent  Medication refill    ED Discharge Orders        Ordered    predniSONE (DELTASONE) 50 MG tablet  Daily     08/17/17 2125       Burgess Amor, PA-C 08/17/17 2349    Eber Hong, MD 08/28/17 [phone]

## 2017-08-17 NOTE — Discharge Instructions (Addendum)
Take your next dose of prednisone tomorrow evening.  Use your inhaler - 2 puffs every 4 hours if you are wheezing or short of breath.  Get rechecked for any worsened symptoms as discussed.    [facility]  [address] [place], [place] [address] [phone]  Services [facility] offers a variety of basic health services.  Services include but are not limited to: Blood pressure checks  Heart rate checks  Blood sugar checks  Urine analysis  Rapid strep tests  Pregnancy tests.  Health education and referrals  People needing more complex services will be directed to a physician online. Using these virtual visits, doctors can evaluate and prescribe medicine and treatments. There will be no medication on-site, though [facility] will help patients fill their prescriptions at little to no cost.   For More information please go to: [email or web address]

## 2017-08-17 NOTE — ED Triage Notes (Signed)
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

## 2017-08-22 ENCOUNTER — Emergency Department: Admission: EM | Admit: 2017-08-22 | Discharge: 2017-08-22 | Discharge status: Against Medical Advice | Payer: Self-pay

## 2017-08-22 NOTE — ED Triage Notes (Signed)
Pt told police officer "she was going to come back when it wasn't so busy". Pt ambulatory to exit.

## 2017-09-21 ENCOUNTER — Emergency Department (HOSPITAL_COMMUNITY): Payer: Self-pay

## 2017-09-21 ENCOUNTER — Other Ambulatory Visit: Payer: Self-pay

## 2017-09-21 ENCOUNTER — Encounter (HOSPITAL_COMMUNITY): Payer: Self-pay | Admitting: Emergency Medicine

## 2017-09-21 ENCOUNTER — Emergency Department (HOSPITAL_COMMUNITY)
Admission: EM | Admit: 2017-09-21 | Discharge: 2017-09-21 | Disposition: A | Discharge status: Home / Self Care | Payer: Self-pay | Attending: Emergency Medicine | Admitting: Emergency Medicine

## 2017-09-21 DIAGNOSIS — Z79899 Other long term (current) drug therapy: Secondary | ICD-10-CM | POA: Insufficient documentation

## 2017-09-21 DIAGNOSIS — J45901 Unspecified asthma with (acute) exacerbation: Secondary | ICD-10-CM | POA: Insufficient documentation

## 2017-09-21 DIAGNOSIS — F17210 Nicotine dependence, cigarettes, uncomplicated: Secondary | ICD-10-CM | POA: Insufficient documentation

## 2017-09-21 DIAGNOSIS — J4521 Mild intermittent asthma with (acute) exacerbation: Secondary | ICD-10-CM

## 2017-09-21 DIAGNOSIS — Z760 Encounter for issue of repeat prescription: Secondary | ICD-10-CM

## 2017-09-21 MED ORDER — PREDNISONE 20 MG PO TABS: 40.0000 mg | ORAL_TABLET | Freq: Every day | ORAL | 0 refills | Status: DC

## 2017-09-21 MED ORDER — ALBUTEROL SULFATE (2.5 MG/3ML) 0.083% IN NEBU
2.5000 mg | INHALATION_SOLUTION | Freq: Once | RESPIRATORY_TRACT | Status: AC
  Administered 2017-09-21: 2.5 mg via RESPIRATORY_TRACT
  Filled 2017-09-21: qty 3

## 2017-09-21 MED ORDER — IPRATROPIUM-ALBUTEROL 0.5-2.5 (3) MG/3ML IN SOLN
3.0000 mL | Freq: Once | RESPIRATORY_TRACT | Status: AC
  Administered 2017-09-21: 3 mL via RESPIRATORY_TRACT
  Filled 2017-09-21: qty 3

## 2017-09-21 MED ORDER — ALBUTEROL SULFATE HFA 108 (90 BASE) MCG/ACT IN AERS: 2.0000 | INHALATION_SPRAY | RESPIRATORY_TRACT | 0 refills | Status: AC | PRN

## 2017-09-21 MED ORDER — PREDNISONE 50 MG PO TABS
60.0000 mg | ORAL_TABLET | Freq: Once | ORAL | Status: AC
  Administered 2017-09-21: 60 mg via ORAL
  Filled 2017-09-21: qty 1

## 2017-09-21 NOTE — ED Triage Notes (Signed)
Cough congestion for three days. States has seasonal allergies, out of inhaler.

## 2017-09-21 NOTE — Discharge Instructions (Signed)
Take the prescriptions as directed.  Use your albuterol inhaler (2 to 4 puffs) every 4 hours for the next 7 days, then as needed for cough, wheezing, or shortness of breath.  Call your regular medical doctor today to schedule a follow up appointment within the next 3 days.  Return to the Emergency Department immediately sooner if worsening.  ° °

## 2017-09-21 NOTE — ED Provider Notes (Signed)
[facility] EMERGENCY DEPARTMENT Provider Note   CSN: [id number] Arrival date & time: 09/21/17  4098     History   Chief Complaint Chief Complaint  Patient presents with  . Cough    HPI Tracie Hodge is a 42 y.o. female.  HPI  Pt was seen at 0735.  Per pt, c/o gradual onset and worsening of persistent cough and wheezing for the past 3 days. Has been associated with runny/stuffy nose, nasal, sinus and ears congestion. Pt states she also has run out of her inhaler and is requesting a refill.  Denies CP/palpitations, no back pain, no abd pain, no N/V/D, no fevers, no rash. The symptoms have been associated with no other complaints. The patient has a significant history of similar symptoms previously, recently being evaluated for this complaint and multiple prior evals for same.      Past Medical History:  Diagnosis Date  . Anxiety   . Asthma   . Depression   . GERD (gastroesophageal reflux disease)   . Mental disorder   . Sleep apnea    sleep study done ? 3 years ago, does not use c-pap    Patient Active Problem List   Diagnosis Date Noted  . Cigarette nicotine dependence with nicotine-induced disorder 09/01/2016  . Chronic obstructive pulmonary disease (HCC) 09/01/2016  . Morbid obesity (HCC) 08/23/2016  . DDD (degenerative disc disease), lumbosacral 08/16/2016  . Cocaine use disorder, severe, dependence (HCC) 08/13/2016  . OSA (obstructive sleep apnea) 08/13/2016  . Tobacco use disorder 08/13/2016  . Asthma 08/13/2016  . Severe recurrent major depression without psychotic features (HCC) 08/12/2016  . PTSD (post-traumatic stress disorder) 08/06/2016  . GERD (gastroesophageal reflux disease) 08/06/2016    Past Surgical History:  Procedure Laterality Date  . ABDOMINAL HYSTERECTOMY  2010  . CESAREAN SECTION     x1  . CHOLECYSTECTOMY N/A 02/07/2013   Procedure: LAPAROSCOPIC CHOLECYSTECTOMY;  Surgeon: Axel Filler, MD;  Location: [facility] OR;  Service: General;   Laterality: N/A;     OB History   None      Home Medications    Prior to Admission medications   Medication Sig Start Date End Date Taking? Authorizing Provider  benzonatate (TESSALON) 200 MG capsule Take 1 capsule (200 mg total) by mouth every 8 (eight) hours. 06/22/17   Triplett, Tammy, PA-C  diclofenac (VOLTAREN) 75 MG EC tablet Take 1 tablet (75 mg total) by mouth 2 (two) times daily as needed. 10/11/16   Jacquelin Hawking, PA-C  FLUoxetine (PROZAC) 20 MG capsule Take 1 capsule (20 mg total) by mouth daily. 08/18/16   Jimmy Footman, MD  fluticasone (FLONASE) 50 MCG/ACT nasal spray Place 1 spray into both nostrils daily. 10/15/16 11/14/16  Long, Arlyss Repress, MD  loratadine (CLARITIN) 10 MG tablet Take 1 tablet (10 mg total) by mouth daily. 10/15/16 11/14/16  Long, Arlyss Repress, MD  meloxicam (MOBIC) 7.5 MG tablet Take 1 tablet (7.5 mg total) by mouth daily. 12/08/16   Caccavale, Sophia, PA-C  omeprazole (PRILOSEC) 20 MG capsule Take 20 mg by mouth daily.    [provider]  traZODone (DESYREL) 100 MG tablet Take 1 tablet (100 mg total) by mouth at bedtime. Patient taking differently: Take 200 mg by mouth at bedtime.  08/17/16   Jimmy Footman, MD    Family History Family History  Problem Relation Age of Onset  . COPD Father   . Depression Paternal Grandmother     Social History Social History   Tobacco Use  .  Smoking status: Current Every Day Smoker    Packs/day: 0.75    Years: 30.00    Pack years: 22.50    Types: Cigarettes  . Smokeless tobacco: Never Used  Substance Use Topics  . Alcohol use: No    Comment: never a heavy drinker- last drank april 2018  . Drug use: Yes    Types: Cocaine, "Crack" cocaine, Marijuana    Comment: last use 08-11-16     Allergies   Patient has no known allergies.   Review of Systems Review of Systems ROS: Statement: All systems negative except as marked or noted in the HPI; Constitutional: Negative for fever and  chills. ; ; Eyes: Negative for eye pain, redness and discharge. ; ; ENMT: Negative for ear pain, hoarseness, sore throat. +nasal congestion, sinus pressure and rhinorrhea. ; ; Cardiovascular: Negative for chest pain, palpitations, diaphoresis, dyspnea and peripheral edema. ; ; Respiratory: +cough, wheezing. Negative for stridor. ; ; Gastrointestinal: Negative for nausea, vomiting, diarrhea, abdominal pain, blood in stool, hematemesis, jaundice and rectal bleeding. . ; ; Genitourinary: Negative for dysuria, flank pain and hematuria. ; ; Musculoskeletal: Negative for back pain and neck pain. Negative for swelling and trauma.; ; Skin: Negative for pruritus, rash, abrasions, blisters, bruising and skin lesion.; ; Neuro: Negative for headache, lightheadedness and neck stiffness. Negative for weakness, altered level of consciousness, altered mental status, extremity weakness, paresthesias, involuntary movement, seizure and syncope.       Physical Exam Updated Vital Signs BP (!) 140/99 (BP Location: Right Arm)   Pulse 77   Temp 98 F (36.7 C) (Oral)   Resp 18   LMP  (LMP Unknown)   SpO2 97%   Physical Exam 0740: Physical examination:  Nursing notes reviewed; Vital signs and O2 SAT reviewed;  Constitutional: Well developed, Well nourished, Well hydrated, In no acute distress; Head:  Normocephalic, atraumatic; Eyes: EOMI, PERRL, No scleral icterus; ENMT: TM's clear bilat. +edemetous nasal turbinates bilat with clear rhinorrhea. Mouth and pharynx without lesions. No tonsillar exudates. No intra-oral edema. No submandibular or sublingual edema. No hoarse voice, no drooling, no stridor. No pain with manipulation of larynx. No trismus. Mouth and pharynx normal, Mucous membranes moist; Neck: Supple, Full range of motion, No lymphadenopathy; Cardiovascular: Regular rate and rhythm, No gallop; Respiratory: Breath sounds coarse & equal bilaterally, rare wheeze. No audible wheezing. +moist cough during exam.   Speaking full sentences with ease, Normal respiratory effort/excursion; Chest: Nontender, Movement normal; Abdomen: Soft, Nontender, Nondistended, Normal bowel sounds; Genitourinary: No CVA tenderness; Extremities: Peripheral pulses normal, No tenderness, No edema, No calf edema or asymmetry.; Neuro: AA&Ox3, Major CN grossly intact.  Speech clear. No gross focal motor or sensory deficits in extremities.; Skin: Color normal, Warm, Dry.   ED Treatments / Results  Labs (all labs ordered are listed, but only abnormal results are displayed)   EKG None  Radiology   Procedures Procedures (including critical care time)  Medications Ordered in ED Medications  ipratropium-albuterol (DUONEB) 0.5-2.5 (3) MG/3ML nebulizer solution 3 mL (has no administration in time range)  albuterol (PROVENTIL) (2.5 MG/3ML) 0.083% nebulizer solution 2.5 mg (has no administration in time range)     Initial Impression / Assessment and Plan / ED Course  I have reviewed the triage vital signs and the nursing notes.  Pertinent labs & imaging results that were available during my care of the patient were reviewed by me and considered in my medical decision making (see chart for details).  MDM Reviewed: previous chart, nursing  note and vitals Interpretation: x-ray    Dg Chest 2 View Result Date: 09/21/2017 CLINICAL DATA:  Chest pain. EXAM: CHEST - 2 VIEW COMPARISON:  Radiographs of February 21, 2016. FINDINGS: The heart size and mediastinal contours are within normal limits. Both lungs are clear. No pneumothorax or pleural effusion is noted. The visualized skeletal structures are unremarkable. IMPRESSION: No active cardiopulmonary disease. Electronically Signed   By: Lupita Raider, M.D.   On: 09/21/2017 09:12    1045:  Pt states she "feels better" after neb and steroid.  NAD, lungs coarse bilat, no wheezing, resps easy, speaking full sentences, Sats 97% R/A. Pt states she wants to go home now. Requesting MDI  refill and work note. Pt strongly encouraged to obtain PMD and/or Pulm MD for good continuity of care and control of her chronic medical condition.  Pt verb understanding.         Final Clinical Impressions(s) / ED Diagnoses   Final diagnoses:  None    ED Discharge Orders    None       Samuel Jester, DO 09/26/17 0801

## 2017-10-16 ENCOUNTER — Emergency Department
Admission: EM | Admit: 2017-10-16 | Discharge: 2017-10-16 | Disposition: A | Discharge status: Home / Self Care | Payer: Self-pay | Attending: Emergency Medicine | Admitting: Emergency Medicine

## 2017-10-16 ENCOUNTER — Encounter: Payer: Self-pay | Admitting: Emergency Medicine

## 2017-10-16 ENCOUNTER — Other Ambulatory Visit: Payer: Self-pay

## 2017-10-16 DIAGNOSIS — F17210 Nicotine dependence, cigarettes, uncomplicated: Secondary | ICD-10-CM | POA: Insufficient documentation

## 2017-10-16 DIAGNOSIS — J0100 Acute maxillary sinusitis, unspecified: Secondary | ICD-10-CM | POA: Insufficient documentation

## 2017-10-16 DIAGNOSIS — J449 Chronic obstructive pulmonary disease, unspecified: Secondary | ICD-10-CM | POA: Insufficient documentation

## 2017-10-16 MED ORDER — FLUTICASONE PROPIONATE 50 MCG/ACT NA SUSP: 2.0000 | Freq: Every day | NASAL | 0 refills | Status: AC

## 2017-10-16 MED ORDER — ALBUTEROL SULFATE HFA 108 (90 BASE) MCG/ACT IN AERS: 2.0000 | INHALATION_SPRAY | Freq: Four times a day (QID) | RESPIRATORY_TRACT | 0 refills | Status: AC | PRN

## 2017-10-16 MED ORDER — AMOXICILLIN 500 MG PO CAPS: 500.0000 mg | ORAL_CAPSULE | Freq: Three times a day (TID) | ORAL | 0 refills | Status: AC

## 2017-10-16 NOTE — ED Triage Notes (Signed)
Says she thinks she has a sinus infection for about a week.  Does not think she has had fever.

## 2017-10-16 NOTE — ED Provider Notes (Signed)
[facility] Emergency Department Provider Note ____________________________________________  Time seen: 1149  I have reviewed the triage vital signs and the nursing notes.  HISTORY  Chief Complaint  Nasal Congestion  HPI Tracie Hodge is a 42 y.o. female presents herself to the ED for evaluation of sinus congestion and pressure.  Patient denies any interim fevers, chills, or sweats. Patient does report some intermittent purulent nasal drainage.  He has been taking Mucinex and DayQuil over-the-counter with some limited benefit.  Past Medical History:  Diagnosis Date  . Anxiety   . Asthma   . Depression   . GERD (gastroesophageal reflux disease)   . Mental disorder   . Sleep apnea    sleep study done ? 3 years ago, does not use c-pap    Patient Active Problem List   Diagnosis Date Noted  . Cigarette nicotine dependence with nicotine-induced disorder 09/01/2016  . Chronic obstructive pulmonary disease (HCC) 09/01/2016  . Morbid obesity (HCC) 08/23/2016  . DDD (degenerative disc disease), lumbosacral 08/16/2016  . Cocaine use disorder, severe, dependence (HCC) 08/13/2016  . OSA (obstructive sleep apnea) 08/13/2016  . Tobacco use disorder 08/13/2016  . Asthma 08/13/2016  . Severe recurrent major depression without psychotic features (HCC) 08/12/2016  . PTSD (post-traumatic stress disorder) 08/06/2016  . GERD (gastroesophageal reflux disease) 08/06/2016    Past Surgical History:  Procedure Laterality Date  . ABDOMINAL HYSTERECTOMY  2010  . CESAREAN SECTION     x1  . CHOLECYSTECTOMY N/A 02/07/2013   Procedure: LAPAROSCOPIC CHOLECYSTECTOMY;  Surgeon: Axel Filler, MD;  Location: [facility] OR;  Service: General;  Laterality: N/A;    Prior to Admission medications   Medication Sig Start Date End Date Taking? Authorizing Provider  albuterol (PROVENTIL HFA;VENTOLIN HFA) 108 (90 Base) MCG/ACT inhaler Inhale 2 puffs into the lungs every 4 (four) hours as needed  for wheezing or shortness of breath. 09/21/17   Samuel Jester, DO  albuterol (PROVENTIL HFA;VENTOLIN HFA) 108 (90 Base) MCG/ACT inhaler Inhale 2 puffs into the lungs every 6 (six) hours as needed for wheezing or shortness of breath. 10/16/17   Haile Toppins, Charlesetta Ivory, PA-C  amoxicillin (AMOXIL) 500 MG capsule Take 1 capsule (500 mg total) by mouth 3 (three) times daily. 10/16/17   Lanitra Battaglini, Charlesetta Ivory, PA-C  fluticasone (FLONASE) 50 MCG/ACT nasal spray Place 2 sprays into both nostrils daily. 10/16/17   Aubrey Voong, Charlesetta Ivory, PA-C    Allergies Patient has no known allergies.  Family History  Problem Relation Age of Onset  . COPD Father   . Depression Paternal Grandmother     Social History Social History   Tobacco Use  . Smoking status: Current Every Day Smoker    Packs/day: 0.75    Years: 30.00    Pack years: 22.50    Types: Cigarettes  . Smokeless tobacco: Never Used  Substance Use Topics  . Alcohol use: No    Comment: never a heavy drinker- last drank april 2018  . Drug use: Yes    Types: Cocaine, "Crack" cocaine, Marijuana    Comment: last use 08-11-16    Review of Systems  Constitutional: Negative for fever. Eyes: Negative for visual changes. ENT: Negative for sore throat.  Reports sinus congestion as above. Cardiovascular: Negative for chest pain. Respiratory: Negative for shortness of breath. Gastrointestinal: Negative for abdominal pain, vomiting and diarrhea. Skin: Negative for rash. Neurological: Negative for headaches, focal weakness or numbness. ____________________________________________  PHYSICAL EXAM:  VITAL SIGNS: ED Triage Vitals  Enc Vitals Group     BP 10/16/17 1056 (!) 132/100     Pulse Rate 10/16/17 1056 72     Resp 10/16/17 1056 18     Temp 10/16/17 1056 97.8 F (36.6 C)     Temp Source 10/16/17 1056 Oral     SpO2 10/16/17 1056 97 %     Weight 10/16/17 1142 260 lb (117.9 kg)     Height 10/16/17 1143 5\' 5"  (1.651 m)     Head  Circumference --      Peak Flow --      Pain Score 10/16/17 1052 5     Pain Loc --      Pain Edu? --      Excl. in GC? --     Constitutional: Alert and oriented. Well appearing and in no distress. Head: Normocephalic and atraumatic.  Patient with maxillary facial tenderness on palpation. Eyes: Conjunctivae are normal. PERRL. Normal extraocular movements Ears: Canals clear. TMs intact bilaterally. Nose: No congestion/rhinorrhea/epistaxis.  Turbinates are enlarged and edematous. Mouth/Throat: Mucous membranes are moist.  Uvula is midline and tonsils are flat.  No oropharyngeal lesions are appreciated. Neck: Supple. No thyromegaly. Hematological/Lymphatic/Immunological: No cervical lymphadenopathy. Cardiovascular: Normal rate, regular rhythm. Normal distal pulses. Respiratory: Normal respiratory effort. No wheezes/rales/rhonchi. ____________________________________________  INITIAL IMPRESSION / ASSESSMENT AND PLAN / ED COURSE  Patient with ED evaluation of a one-week complaint of sinus congestion, purulent nasal drainage, and intermittent nausea.  Patient presentation may represent an early sinusitis.  She will be discharged with instructions to start over-the-counter decongestant as well as a guaifenesin to thin the nasal mucus.  Be discharged with prescriptions for albuterol as well as Flonase.  He is given a prescription for amoxicillin but is advised that she may hold his prescription for several days while she tries the other medications in the interim.  If she decides to start antibiotic she is encouraged and advised that she must complete it.  [facility]. ____________________________________________  FINAL CLINICAL IMPRESSION(S) / ED DIAGNOSES  Final diagnoses:  Acute maxillary sinusitis, recurrence not specified      Lissa HoardMenshew, Saeed Toren V Bacon, PA-C 10/16/17 1756    Sharyn CreamerQuale, Mark, MD 10/26/17 2125

## 2017-10-16 NOTE — ED Notes (Signed)
See triage note. States she is out of her inhaler   Also having some sinus pressure and congestion

## 2017-10-16 NOTE — Discharge Instructions (Addendum)
You are being treated for sinusitis. Take the prescription meds as directed. You should also start an OTC decongestant (pseudoephedrine) for sinus pressure and Mucinex (guaifenesin) for thinning the sinus congestion & mucous. [facility] as [facility].

## 2018-04-02 DEATH — deceased

## 2018-09-13 IMAGING — DX DG CHEST 1V
1 series · 1 of 1 positions shown · non-contrast
Comparison: None.

CLINICAL DATA: Altered mental status, overdose,

EXAM:
CHEST 1 VIEW

[chest ap]
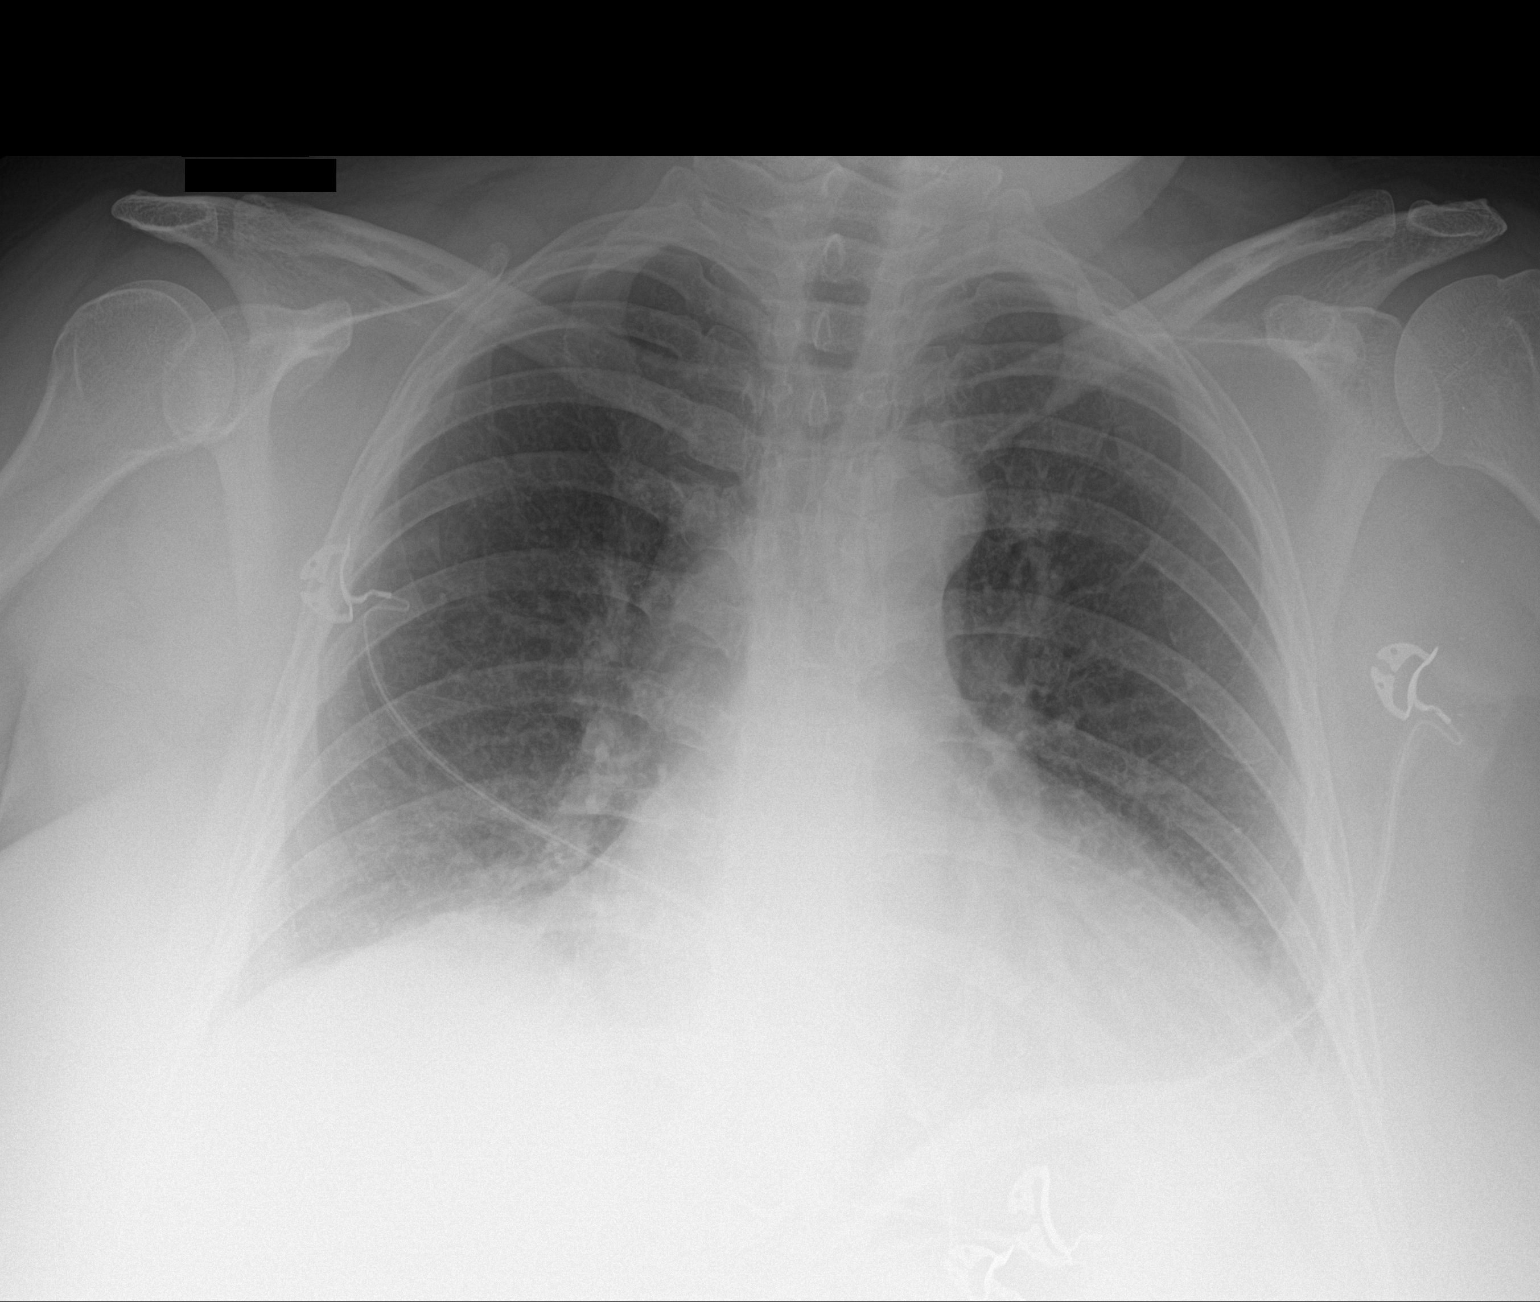

[1 of 1 positions shown; findings below may reference images not displayed]

FINDINGS: There is mild bilateral interstitial prominence. There is no focal
consolidation. There is no pleural effusion or pneumothorax. There
is cardiomegaly.

The osseous structures are unremarkable.
IMPRESSION: No active disease.
